# Patient Record
Sex: Male | Born: 1945 | Race: Black or African American | Hispanic: No | Marital: Married | Smoking: Former smoker
Health system: Southern US, Community
[De-identification: ages and names within clinical notes are randomized; demographics above are authoritative.]

## PROBLEM LIST (undated history)

## (undated) DIAGNOSIS — I639 Cerebral infarction, unspecified: Secondary | ICD-10-CM

## (undated) DIAGNOSIS — Z87442 Personal history of urinary calculi: Secondary | ICD-10-CM

## (undated) DIAGNOSIS — M1990 Unspecified osteoarthritis, unspecified site: Secondary | ICD-10-CM

## (undated) DIAGNOSIS — E119 Type 2 diabetes mellitus without complications: Secondary | ICD-10-CM

## (undated) DIAGNOSIS — E7800 Pure hypercholesterolemia, unspecified: Secondary | ICD-10-CM

## (undated) DIAGNOSIS — I10 Essential (primary) hypertension: Secondary | ICD-10-CM

## (undated) DIAGNOSIS — G4730 Sleep apnea, unspecified: Secondary | ICD-10-CM

## (undated) HISTORY — PX: CARDIAC CATHETERIZATION: SHX172

---

## 2004-06-23 ENCOUNTER — Inpatient Hospital Stay: Payer: Self-pay

## 2004-06-23 ENCOUNTER — Other Ambulatory Visit: Payer: Self-pay

## 2004-12-25 ENCOUNTER — Ambulatory Visit: Payer: Self-pay | Admitting: Gastroenterology

## 2005-08-18 ENCOUNTER — Ambulatory Visit: Payer: Self-pay | Admitting: Cardiology

## 2008-04-26 ENCOUNTER — Ambulatory Visit: Payer: Self-pay | Admitting: Gastroenterology

## 2008-09-06 ENCOUNTER — Emergency Department: Payer: Self-pay | Admitting: Emergency Medicine

## 2013-07-20 ENCOUNTER — Ambulatory Visit: Payer: Self-pay | Admitting: Gastroenterology

## 2015-01-07 ENCOUNTER — Emergency Department: Payer: Medicare HMO

## 2015-01-07 ENCOUNTER — Encounter: Payer: Self-pay | Admitting: Emergency Medicine

## 2015-01-07 ENCOUNTER — Emergency Department
Admission: EM | Admit: 2015-01-07 | Discharge: 2015-01-07 | Disposition: A | Discharge status: Home / Self Care | Payer: Medicare HMO | Attending: Emergency Medicine | Admitting: Emergency Medicine

## 2015-01-07 DIAGNOSIS — E119 Type 2 diabetes mellitus without complications: Secondary | ICD-10-CM | POA: Insufficient documentation

## 2015-01-07 DIAGNOSIS — Z87891 Personal history of nicotine dependence: Secondary | ICD-10-CM | POA: Insufficient documentation

## 2015-01-07 DIAGNOSIS — I10 Essential (primary) hypertension: Secondary | ICD-10-CM | POA: Diagnosis not present

## 2015-01-07 DIAGNOSIS — M1611 Unilateral primary osteoarthritis, right hip: Secondary | ICD-10-CM | POA: Diagnosis not present

## 2015-01-07 DIAGNOSIS — Z79899 Other long term (current) drug therapy: Secondary | ICD-10-CM | POA: Diagnosis not present

## 2015-01-07 DIAGNOSIS — M25551 Pain in right hip: Secondary | ICD-10-CM | POA: Diagnosis present

## 2015-01-07 HISTORY — DX: Type 2 diabetes mellitus without complications: E11.9

## 2015-01-07 HISTORY — DX: Pure hypercholesterolemia, unspecified: E78.00

## 2015-01-07 HISTORY — DX: Essential (primary) hypertension: I10

## 2015-01-07 MED ORDER — OXYCODONE HCL 5 MG PO TABS: 5.0000 mg | ORAL_TABLET | Freq: Three times a day (TID) | ORAL | Status: AC | PRN

## 2015-01-07 NOTE — ED Notes (Signed)
Pt to triage via wheelchair. Pt reports pain to his right hip for several years. Over the last few weeks pt reports pain has been getting worse. Tonight the pain woke him from sleep. Pt reports he could not put weight on the leg as the pain was so severe he felt nauaseated.

## 2015-01-07 NOTE — ED Provider Notes (Signed)
[facility] Emergency Department Provider Note ____________________________________________  Time seen: Approximately 7:36 AM  I have reviewed the triage vital signs and the nursing notes.   HISTORY  Chief Complaint Hip Pain   HPI NIVEK POWLEY is a 69 y.o. male resents to the emergency department for evaluation of right hip pain. He is aware that he has arthritis in the right hip and believes that this is just worsening. He denies injury. He has taken Tylenol occasionally for the pain, but otherwise does not take any pain medications. Ambulation and movement of the hip triggers the pain.   Past Medical History  Diagnosis Date  . Diabetes mellitus without complication   . Hypertension   . Hypercholesterolemia     There are no active problems to display for this patient.   History reviewed. No pertinent past surgical history.  Current Outpatient Rx  Name  Route  Sig  Dispense  Refill  . amLODipine (NORVASC) 10 MG tablet   Oral   Take 10 mg by mouth daily.         Marland Kitchen atorvastatin (LIPITOR) 80 MG tablet   Oral   Take 80 mg by mouth daily.         . carbamazepine (TEGRETOL XR) 200 MG 12 hr tablet   Oral   Take 200 mg by mouth 2 (two) times daily.         . cloNIDine (CATAPRES) 0.2 MG tablet   Oral   Take 0.4 mg by mouth 2 (two) times daily.         . furosemide (LASIX) 20 MG tablet   Oral   Take 20 mg by mouth.         . linagliptin (TRADJENTA) 5 MG TABS tablet   Oral   Take 5 mg by mouth daily.         . metFORMIN (GLUCOPHAGE) 1000 MG tablet   Oral   Take 500 mg by mouth 2 (two) times daily with a meal.         . metoprolol (LOPRESSOR) 100 MG tablet   Oral   Take 100 mg by mouth 2 (two) times daily.         . niacin 500 MG tablet   Oral   Take 500 mg by mouth at bedtime.         Marland Kitchen olmesartan (BENICAR) 40 MG tablet   Oral   Take 40 mg by mouth daily.         Marland Kitchen oxyCODONE (ROXICODONE) 5 MG immediate release  tablet   Oral   Take 1 tablet (5 mg total) by mouth every 8 (eight) hours as needed.   20 tablet   0     Allergies Diovan; Ace inhibitors; and Hydrochlorothiazide  No family history on file.  Social History Social History  Substance Use Topics  . Smoking status: Former Games developer  . Smokeless tobacco: None  . Alcohol Use: No    Review of Systems Constitutional: No recent illness. Eyes: No visual changes. ENT: No sore throat. Cardiovascular: Denies chest pain or palpitations. Respiratory: Denies shortness of breath. Gastrointestinal: No abdominal pain.  Genitourinary: Negative for dysuria. Musculoskeletal: Pain in right hip without radiation. Skin: Negative for rash. Neurological: Negative for headaches, focal weakness or numbness. 10-point ROS otherwise negative.  ____________________________________________   PHYSICAL EXAM:  VITAL SIGNS: ED Triage Vitals  Enc Vitals Group     BP 01/07/15 0442 152/90 mmHg     Pulse Rate 01/07/15  0442 80     Resp 01/07/15 0442 18     Temp 01/07/15 0442 98.2 F (36.8 C)     Temp Source 01/07/15 0442 Oral     SpO2 01/07/15 0442 100 %     Weight 01/07/15 0442 215 lb (97.523 kg)     Height 01/07/15 0442 6' (1.829 m)     Head Cir --      Peak Flow --      Pain Score 01/07/15 0443 10     Pain Loc --      Pain Edu? --      Excl. in GC? --     Constitutional: Alert and oriented. Well appearing and in no acute distress. Eyes: Conjunctivae are normal. EOMI. Head: Atraumatic. Nose: No congestion/rhinnorhea. Neck: No stridor.  Respiratory: Normal respiratory effort.   Musculoskeletal: Pain with internal or external rotation of the hip. Neurologic:  Normal speech and language. No gross focal neurologic deficits are appreciated. Speech is normal. No gait instability. Skin:  Skin is warm, dry and intact. Atraumatic. Psychiatric: Mood and affect are normal. Speech and behavior are  normal.  ____________________________________________   LABS (all labs ordered are listed, but only abnormal results are displayed)  Labs Reviewed - No data to display ____________________________________________  RADIOLOGY  Severe osteoarthritis of the right hip.  I, Kem Boroughs, personally viewed and evaluated these images (plain radiographs) as part of my medical decision making.  ____________________________________________   PROCEDURES  Procedure(s) performed: none    ____________________________________________   INITIAL IMPRESSION / ASSESSMENT AND PLAN / ED COURSE  Pertinent labs & imaging results that were available during my care of the patient were reviewed by me and considered in my medical decision making (see chart for details).  Patient was advised to follow up with orthopedics. He was  also advised to return to the ER for symptoms that change or worsen if unable to schedule an appointment.  ____________________________________________   FINAL CLINICAL IMPRESSION(S) / ED DIAGNOSES  Final diagnoses:  Primary osteoarthritis of right hip       Chinita Pester, FNP 01/07/15 0830  Jennye Moccasin, MD 01/07/15 1053

## 2017-05-11 ENCOUNTER — Other Ambulatory Visit: Payer: Self-pay

## 2017-05-11 ENCOUNTER — Emergency Department
Admission: EM | Admit: 2017-05-11 | Discharge: 2017-05-11 | Disposition: A | Discharge status: Home / Self Care | Payer: Medicare HMO | Attending: Emergency Medicine | Admitting: Emergency Medicine

## 2017-05-11 ENCOUNTER — Encounter: Payer: Self-pay | Admitting: *Deleted

## 2017-05-11 DIAGNOSIS — R110 Nausea: Secondary | ICD-10-CM | POA: Diagnosis present

## 2017-05-11 DIAGNOSIS — Z87891 Personal history of nicotine dependence: Secondary | ICD-10-CM | POA: Insufficient documentation

## 2017-05-11 DIAGNOSIS — E119 Type 2 diabetes mellitus without complications: Secondary | ICD-10-CM | POA: Diagnosis not present

## 2017-05-11 DIAGNOSIS — Z79899 Other long term (current) drug therapy: Secondary | ICD-10-CM | POA: Diagnosis not present

## 2017-05-11 DIAGNOSIS — N289 Disorder of kidney and ureter, unspecified: Secondary | ICD-10-CM | POA: Diagnosis not present

## 2017-05-11 DIAGNOSIS — Z7984 Long term (current) use of oral hypoglycemic drugs: Secondary | ICD-10-CM | POA: Insufficient documentation

## 2017-05-11 DIAGNOSIS — I10 Essential (primary) hypertension: Secondary | ICD-10-CM | POA: Diagnosis not present

## 2017-05-11 LAB — CBC
HEMATOCRIT: 38.8 % — AB (ref 40.0–52.0)
HEMOGLOBIN: 12.7 g/dL — AB (ref 13.0–18.0)
MCH: 28.4 pg (ref 26.0–34.0)
MCHC: 32.7 g/dL (ref 32.0–36.0)
MCV: 86.8 fL (ref 80.0–100.0)
Platelets: 240 10*3/uL (ref 150–440)
RBC: 4.47 MIL/uL (ref 4.40–5.90)
RDW: 14.4 % (ref 11.5–14.5)
WBC: 5.5 10*3/uL (ref 3.8–10.6)

## 2017-05-11 LAB — COMPREHENSIVE METABOLIC PANEL
ALBUMIN: 3.9 g/dL (ref 3.5–5.0)
ALK PHOS: 48 U/L (ref 38–126)
ALT: 27 U/L (ref 17–63)
ANION GAP: 7 (ref 5–15)
AST: 24 U/L (ref 15–41)
BUN: 17 mg/dL (ref 6–20)
CALCIUM: 9.4 mg/dL (ref 8.9–10.3)
CO2: 27 mmol/L (ref 22–32)
Chloride: 105 mmol/L (ref 101–111)
Creatinine, Ser: 1.92 mg/dL — ABNORMAL HIGH (ref 0.61–1.24)
GFR calc Af Amer: 39 mL/min — ABNORMAL LOW (ref >60)
GFR calc non Af Amer: 34 mL/min — ABNORMAL LOW (ref >60)
Glucose, Bld: 236 mg/dL — ABNORMAL HIGH (ref 65–99)
Potassium: 3.6 mmol/L (ref 3.5–5.1)
SODIUM: 139 mmol/L (ref 135–145)
Total Bilirubin: 0.6 mg/dL (ref 0.3–1.2)
Total Protein: 6.8 g/dL (ref 6.5–8.1)

## 2017-05-11 LAB — TROPONIN I

## 2017-05-11 NOTE — Discharge Instructions (Signed)
As we discussed, your workup today was reassuring, and likely you were feeling sick to her stomach because of eating too big meal for the holidays.  Your kidney function is not "normal" although it may be normal for you; we cannot access your medical records to tell us what is your baseline kidney function.  Today your BUN was 17 but your creatinine was 1.92.  Please let your primary care doctor know this when you call at the next available opportunity to schedule a follow-up appointment.  Your doctor will likely want to recheck your blood work at that time and less this is normal for you.  Return to the emergency department if you develop new or worsening symptoms that concern you.

## 2017-05-11 NOTE — ED Provider Notes (Signed)
[facility] Emergency Department Provider Note  ____________________________________________   First MD Initiated Contact with Patient 05/11/17 [phone]     (approximate)  I have reviewed the triage vital signs and the nursing notes.   HISTORY  Chief Complaint Nausea    HPI Eric Andersen is a 71 y.o. male with medical history as listed below but who has very little medical information and [facility] or even in care everywhere.  He presents by EMS for evaluation of acute onset nausea after eating a big Christmas meal with his family.  He felt very sick to his stomach but did not vomit.  He denies chest pain, shortness of breath, abdominal pain, dizziness, weakness, and headache.  His nausea had completely resolved after getting to the emergency department.  He is happy, cheerful, and in his normal state of health according to the family members who are present.  The family confirmed that he did eat a very large meal and he laughed and told me that he tried eat a little bit of everything and by multiple family members at a big get together.  Reports no distress at this time.  His symptoms were relatively acute in onset, severe, and improved without any intervention.  Nothing in particular made the symptoms better nor worse.  Past Medical History:  Diagnosis Date  . Diabetes mellitus without complication (HCC)   . Hypercholesterolemia   . Hypertension     There are no active problems to display for this patient.   No past surgical history on file.  Prior to Admission medications   Medication Sig Start Date End Date Taking? Authorizing Provider  amLODipine (NORVASC) 10 MG tablet Take 10 mg by mouth daily.    [provider]  atorvastatin (LIPITOR) 80 MG tablet Take 80 mg by mouth daily.    [provider]  carbamazepine (TEGRETOL XR) 200 MG 12 hr tablet Take 200 mg by mouth 2 (two) times daily.    [provider]  cloNIDine (CATAPRES) 0.2 MG  tablet Take 0.4 mg by mouth 2 (two) times daily.    [provider]  furosemide (LASIX) 20 MG tablet Take 20 mg by mouth.    [provider]  linagliptin (TRADJENTA) 5 MG TABS tablet Take 5 mg by mouth daily.    [provider]  metFORMIN (GLUCOPHAGE) 1000 MG tablet Take 500 mg by mouth 2 (two) times daily with a meal.    [provider]  metoprolol (LOPRESSOR) 100 MG tablet Take 100 mg by mouth 2 (two) times daily.    [provider]  niacin 500 MG tablet Take 500 mg by mouth at bedtime.    [provider]  olmesartan (BENICAR) 40 MG tablet Take 40 mg by mouth daily.    [provider]    Allergies Diovan [valsartan]; Ace inhibitors; and Hydrochlorothiazide  No family history on file.  Social History Social History   Tobacco Use  . Smoking status: Former Smoker  Substance Use Topics  . Alcohol use: No  . Drug use: Not on file    Review of Systems Constitutional: No fever/chills.  No weakness nor lightheadedness. Cardiovascular: Denies chest pain. Respiratory: Denies shortness of breath. Gastrointestinal: Nausea, no vomiting.  No diarrhea.  No abdominal pain.   Genitourinary: Negative for dysuria. Musculoskeletal: Negative for neck pain.  Negative for back pain. Integumentary: Negative for rash. Neurological: Negative for headaches, focal weakness or numbness.   ____________________________________________   PHYSICAL EXAM:  ED  Triage Vitals  Enc Vitals Group     BP 05/11/17 0140 125/80     Pulse Rate 05/11/17 0140 82     Resp 05/11/17 0140 (!) 25     Temp 05/11/17 0032 98.1 F (36.7 C)     Temp Source 05/11/17 0032 Oral     SpO2 05/11/17 0140 100 %     Weight 05/11/17 0033 99.8 kg (220 lb)     Height 05/11/17 0033 1.854 m (6\' 1" )     Head Circumference --      Peak Flow --      Pain Score --      Pain Loc --      Pain Edu? --      Excl. in GC? --      Constitutional: Alert and oriented. Well  appearing and in no acute distress. Eyes: Conjunctivae are normal.  Head: Atraumatic. Nose: No congestion/rhinnorhea. Mouth/Throat: Mucous membranes are moist. Cardiovascular: Normal rate, regular rhythm. Good peripheral circulation. Grossly normal heart sounds. Respiratory: Normal respiratory effort.  No retractions. Lungs CTAB. Gastrointestinal: Soft and mildly distended but nontender. Musculoskeletal: No lower extremity tenderness nor edema. No gross deformities of extremities. Neurologic:  Normal speech and language. No gross focal neurologic deficits are appreciated.  Skin:  Skin is warm, dry and intact. No rash noted. Psychiatric: Mood and affect are normal. Speech and behavior are normal.  ____________________________________________   LABS (all labs ordered are listed, but only abnormal results are displayed)  Labs Reviewed  COMPREHENSIVE METABOLIC PANEL - Abnormal; Notable for the following components:      Result Value   Glucose, Bld 236 (*)    Creatinine, Ser 1.92 (*)    GFR calc non Af Amer 34 (*)    GFR calc Af Amer 39 (*)    All other components within normal limits  CBC - Abnormal; Notable for the following components:   Hemoglobin 12.7 (*)    HCT 38.8 (*)    All other components within normal limits  TROPONIN I   ____________________________________________  EKG  ED ECG REPORT I, Loleta Roseory Miriya Cloer, the attending physician, personally viewed and interpreted this ECG.  Date: 05/11/2017 EKG Time: 00: 35 Rate: 88 Rhythm: Sinus rhythm with first-degree AV block with PR interval of 223 ms QRS Axis: normal Intervals: Right bundle branch block and left anterior fascicular block ST/T Wave abnormalities: Non-specific ST segment / T-wave changes, but no evidence of acute ischemia. Narrative Interpretation: no evidence of acute ischemia   ____________________________________________  RADIOLOGY   No results  found.  ____________________________________________   PROCEDURES  Critical Care performed: No   Procedure(s) performed:   Procedures   ____________________________________________   INITIAL IMPRESSION / ASSESSMENT AND PLAN / ED COURSE  As part of my medical decision making, I reviewed the following data within the electronic MEDICAL RECORD NUMBER Nursing notes reviewed and incorporated, EKG interpreted  and Old chart reviewed    Differential diagnosis includes, but is not limited to, nausea secondary to overeating at a holiday meal, obstruction/ileus, infectious process, etc.  However, the patient is very well-appearing and happy at this time and completely asymptomatic.  Vitals signs are normal.  Lab workup is reassuing except for a creatinine of 1.92 but with a normal BUN.  I looked back through the medical records and in care everywhere several years ago the patient had a creatinine of 1.3.  I suspect that 1.9 may be the patient's baseline; he has no evidence of dehydration and there is  nothing about his current presentation, physical exam, or history of present illness to suggest that he is in acute renal failure.  I discussed with the family and the patient and they do not know what his baseline creatinine is and he goes to an outside primary care provider and we do not have access to the records.  Given how well-appearing he is at this time, I encouraged him to drink plenty of clear, low calorie fluids such as water or low calorie Gatorade to stay hydrated and follow-up at the next available opportunity with his primary care doctor.  I encouraged him to have repeat lab work drawn to make sure that his kidney function is at his own baseline, but based on his current presentation I do not feel he would benefit from admission for further workup of what very likely is essentially baseline kidney function for him.  They understand and agree with the plan.  Additionally based on his  presentation I have a very low suspicion that this is an anginal equivalent.  He had a large meal, overate, was nauseated, and now feels better.  He and his family are comfortable with him going home.  I gave my usual and customary return precautions.     ____________________________________________  FINAL CLINICAL IMPRESSION(S) / ED DIAGNOSES  Final diagnoses:  Nausea  Renal insufficiency     MEDICATIONS GIVEN DURING THIS VISIT:  Medications - No data to display   ED Discharge Orders    None       Note:  This document was prepared using Dragon voice recognition software and may include unintentional dictation errors.    Loleta Rose, MD 05/11/17 [phone]

## 2017-05-11 NOTE — ED Triage Notes (Signed)
Per EMS pt ate dinner and then felt nauseated, diaphoretic and weak. Pt is diabetic and his sugar was 206. Pt feels well now.

## 2017-06-22 ENCOUNTER — Inpatient Hospital Stay
Admission: EM | Admit: 2017-06-22 | Discharge: 2017-06-25 | DRG: 244 | Disposition: A | Discharge status: Home / Self Care | Payer: Medicare HMO | Attending: Internal Medicine | Admitting: Internal Medicine

## 2017-06-22 ENCOUNTER — Other Ambulatory Visit: Payer: Self-pay

## 2017-06-22 ENCOUNTER — Encounter
Admission: EM | Disposition: A | Discharge status: Home / Self Care | Payer: Self-pay | Source: Home / Self Care | Attending: Internal Medicine

## 2017-06-22 ENCOUNTER — Emergency Department: Payer: Medicare HMO

## 2017-06-22 DIAGNOSIS — Z888 Allergy status to other drugs, medicaments and biological substances status: Secondary | ICD-10-CM | POA: Diagnosis not present

## 2017-06-22 DIAGNOSIS — R079 Chest pain, unspecified: Secondary | ICD-10-CM

## 2017-06-22 DIAGNOSIS — Z950 Presence of cardiac pacemaker: Secondary | ICD-10-CM

## 2017-06-22 DIAGNOSIS — E7800 Pure hypercholesterolemia, unspecified: Secondary | ICD-10-CM | POA: Diagnosis present

## 2017-06-22 DIAGNOSIS — Z8673 Personal history of transient ischemic attack (TIA), and cerebral infarction without residual deficits: Secondary | ICD-10-CM

## 2017-06-22 DIAGNOSIS — I441 Atrioventricular block, second degree: Principal | ICD-10-CM | POA: Diagnosis present

## 2017-06-22 DIAGNOSIS — G4733 Obstructive sleep apnea (adult) (pediatric): Secondary | ICD-10-CM | POA: Diagnosis present

## 2017-06-22 DIAGNOSIS — Z87891 Personal history of nicotine dependence: Secondary | ICD-10-CM | POA: Diagnosis not present

## 2017-06-22 DIAGNOSIS — N183 Chronic kidney disease, stage 3 (moderate): Secondary | ICD-10-CM | POA: Diagnosis present

## 2017-06-22 DIAGNOSIS — Z79899 Other long term (current) drug therapy: Secondary | ICD-10-CM

## 2017-06-22 DIAGNOSIS — R001 Bradycardia, unspecified: Secondary | ICD-10-CM | POA: Diagnosis present

## 2017-06-22 DIAGNOSIS — E1122 Type 2 diabetes mellitus with diabetic chronic kidney disease: Secondary | ICD-10-CM | POA: Diagnosis present

## 2017-06-22 DIAGNOSIS — R531 Weakness: Secondary | ICD-10-CM

## 2017-06-22 DIAGNOSIS — Z7984 Long term (current) use of oral hypoglycemic drugs: Secondary | ICD-10-CM

## 2017-06-22 DIAGNOSIS — I129 Hypertensive chronic kidney disease with stage 1 through stage 4 chronic kidney disease, or unspecified chronic kidney disease: Secondary | ICD-10-CM | POA: Diagnosis present

## 2017-06-22 HISTORY — DX: Cerebral infarction, unspecified: I63.9

## 2017-06-22 HISTORY — PX: TEMPORARY PACEMAKER: CATH118268

## 2017-06-22 LAB — PROTIME-INR
INR: 0.9
Prothrombin Time: 12.1 seconds (ref 11.4–15.2)

## 2017-06-22 LAB — COMPREHENSIVE METABOLIC PANEL
ALT: 93 U/L — AB (ref 17–63)
ANION GAP: 10 (ref 5–15)
AST: 87 U/L — ABNORMAL HIGH (ref 15–41)
Albumin: 4 g/dL (ref 3.5–5.0)
Alkaline Phosphatase: 54 U/L (ref 38–126)
BUN: 17 mg/dL (ref 6–20)
CHLORIDE: 104 mmol/L (ref 101–111)
CO2: 26 mmol/L (ref 22–32)
Calcium: 9.7 mg/dL (ref 8.9–10.3)
Creatinine, Ser: 1.59 mg/dL — ABNORMAL HIGH (ref 0.61–1.24)
GFR calc non Af Amer: 43 mL/min — ABNORMAL LOW (ref >60)
GFR, EST AFRICAN AMERICAN: 49 mL/min — AB (ref >60)
Glucose, Bld: 233 mg/dL — ABNORMAL HIGH (ref 65–99)
POTASSIUM: 3.7 mmol/L (ref 3.5–5.1)
SODIUM: 140 mmol/L (ref 135–145)
Total Bilirubin: 0.4 mg/dL (ref 0.3–1.2)
Total Protein: 7.4 g/dL (ref 6.5–8.1)

## 2017-06-22 LAB — CBC WITH DIFFERENTIAL/PLATELET
Basophils Absolute: 0 10*3/uL (ref 0–0.1)
Basophils Relative: 0 %
EOS ABS: 0.2 10*3/uL (ref 0–0.7)
EOS PCT: 3 %
HCT: 40.3 % (ref 40.0–52.0)
Hemoglobin: 13.3 g/dL (ref 13.0–18.0)
LYMPHS PCT: 19 %
Lymphs Abs: 1 10*3/uL (ref 1.0–3.6)
MCH: 28.2 pg (ref 26.0–34.0)
MCHC: 33 g/dL (ref 32.0–36.0)
MCV: 85.6 fL (ref 80.0–100.0)
MONO ABS: 0.4 10*3/uL (ref 0.2–1.0)
Monocytes Relative: 6 %
Neutro Abs: 4.1 10*3/uL (ref 1.4–6.5)
Neutrophils Relative %: 72 %
PLATELETS: 219 10*3/uL (ref 150–440)
RBC: 4.71 MIL/uL (ref 4.40–5.90)
RDW: 14.4 % (ref 11.5–14.5)
WBC: 5.6 10*3/uL (ref 3.8–10.6)

## 2017-06-22 LAB — TROPONIN I
Troponin I: <0.03 ng/mL (ref <0.03)
Troponin I: 0.06 ng/mL (ref <0.03)
Troponin I: 0.06 ng/mL (ref <0.03)

## 2017-06-22 LAB — APTT: aPTT: 26 seconds (ref 24–36)

## 2017-06-22 LAB — TSH: TSH: 1.773 u[IU]/mL (ref 0.350–4.500)

## 2017-06-22 LAB — GLUCOSE, CAPILLARY
Glucose-Capillary: 132 mg/dL — ABNORMAL HIGH (ref 65–99)
Glucose-Capillary: 120 mg/dL — ABNORMAL HIGH (ref 65–99)
Glucose-Capillary: 149 mg/dL — ABNORMAL HIGH (ref 65–99)

## 2017-06-22 LAB — MAGNESIUM: Magnesium: 1.8 mg/dL (ref 1.7–2.4)

## 2017-06-22 LAB — HEMOGLOBIN A1C
Hgb A1c MFr Bld: 6.8 % — ABNORMAL HIGH (ref 4.8–5.6)
MEAN PLASMA GLUCOSE: 148.46 mg/dL

## 2017-06-22 LAB — CARBAMAZEPINE LEVEL, TOTAL: Carbamazepine Lvl: 6.1 ug/mL (ref 4.0–12.0)

## 2017-06-22 LAB — MRSA PCR SCREENING: MRSA by PCR: NEGATIVE

## 2017-06-22 SURGERY — INSERTION, CARDIAC PACEMAKER
Anesthesia: Moderate Sedation

## 2017-06-22 SURGERY — TEMPORARY PACEMAKER
Anesthesia: Moderate Sedation

## 2017-06-22 MED ORDER — ONDANSETRON HCL 4 MG PO TABS: 4.0000 mg | ORAL_TABLET | Freq: Four times a day (QID) | ORAL | Status: DC | PRN

## 2017-06-22 MED ORDER — SODIUM CHLORIDE 0.9 % IV SOLN: INTRAVENOUS | Status: DC

## 2017-06-22 MED ORDER — CHLORHEXIDINE GLUCONATE 4 % EX LIQD
60.0000 mL | Freq: Once | CUTANEOUS | Status: AC
  Administered 2017-06-23: 4 via TOPICAL

## 2017-06-22 MED ORDER — CEFAZOLIN SODIUM-DEXTROSE 2-4 GM/100ML-% IV SOLN: 2.0000 g | INTRAVENOUS | Status: DC

## 2017-06-22 MED ORDER — POLYETHYLENE GLYCOL 3350 17 G PO PACK: 17.0000 g | PACK | Freq: Every day | ORAL | Status: DC | PRN

## 2017-06-22 MED ORDER — INSULIN ASPART 100 UNIT/ML ~~LOC~~ SOLN
0.0000 [IU] | Freq: Every day | SUBCUTANEOUS | Status: DC
  Administered 2017-06-23: 2 [IU] via SUBCUTANEOUS
  Filled 2017-06-22: qty 1

## 2017-06-22 MED ORDER — ACETAMINOPHEN 325 MG PO TABS
650.0000 mg | ORAL_TABLET | Freq: Four times a day (QID) | ORAL | Status: DC | PRN
  Administered 2017-06-23: 650 mg via ORAL
  Filled 2017-06-22: qty 2

## 2017-06-22 MED ORDER — CARBAMAZEPINE ER 100 MG PO TB12
200.0000 mg | ORAL_TABLET | Freq: Two times a day (BID) | ORAL | Status: DC
  Administered 2017-06-22 – 2017-06-25 (×6): 200 mg via ORAL
  Filled 2017-06-22 (×8): qty 2

## 2017-06-22 MED ORDER — ATROPINE SULFATE 1 MG/ML IJ SOLN
0.5000 mg | INTRAMUSCULAR | Status: DC | PRN
  Filled 2017-06-22: qty 0.5

## 2017-06-22 MED ORDER — ATROPINE SULFATE 1 MG/10ML IJ SOSY
PREFILLED_SYRINGE | INTRAMUSCULAR | Status: AC
  Administered 2017-06-22: 0.5 mg
  Filled 2017-06-22: qty 10

## 2017-06-22 MED ORDER — ATROPINE SULFATE 1 MG/ML IJ SOLN
0.5000 mg | Freq: Once | INTRAMUSCULAR | Status: DC
  Filled 2017-06-22: qty 0.5

## 2017-06-22 MED ORDER — HYDRALAZINE HCL 25 MG PO TABS
25.0000 mg | ORAL_TABLET | Freq: Three times a day (TID) | ORAL | Status: DC
Start: 2017-06-22 — End: 2017-06-25
  Administered 2017-06-22 – 2017-06-25 (×6): 25 mg via ORAL
  Filled 2017-06-22 (×6): qty 1

## 2017-06-22 MED ORDER — ALBUTEROL SULFATE (2.5 MG/3ML) 0.083% IN NEBU: 2.5000 mg | INHALATION_SOLUTION | RESPIRATORY_TRACT | Status: DC | PRN

## 2017-06-22 MED ORDER — SODIUM CHLORIDE 0.9 % IR SOLN: 80.0000 mg | Status: AC

## 2017-06-22 MED ORDER — SODIUM CHLORIDE 0.9% FLUSH
3.0000 mL | Freq: Two times a day (BID) | INTRAVENOUS | Status: DC
  Administered 2017-06-22 – 2017-06-25 (×6): 3 mL via INTRAVENOUS

## 2017-06-22 MED ORDER — AMLODIPINE BESYLATE 10 MG PO TABS
10.0000 mg | ORAL_TABLET | Freq: Every day | ORAL | Status: DC
  Administered 2017-06-23 – 2017-06-25 (×3): 10 mg via ORAL
  Filled 2017-06-22 (×3): qty 1

## 2017-06-22 MED ORDER — SODIUM CHLORIDE 0.9 % IV SOLN
INTRAVENOUS | Status: DC
  Administered 2017-06-23 (×3): via INTRAVENOUS

## 2017-06-22 MED ORDER — SODIUM CHLORIDE 0.9 % IV SOLN
Freq: Once | INTRAVENOUS | Status: AC
  Administered 2017-06-22: 10:00:00 via INTRAVENOUS

## 2017-06-22 MED ORDER — ONDANSETRON HCL 4 MG/2ML IJ SOLN: 4.0000 mg | Freq: Four times a day (QID) | INTRAMUSCULAR | Status: DC | PRN

## 2017-06-22 MED ORDER — CEFAZOLIN SODIUM-DEXTROSE 2-4 GM/100ML-% IV SOLN
2.0000 g | INTRAVENOUS | Status: AC
  Administered 2017-06-23: 2 g via INTRAVENOUS
  Filled 2017-06-22: qty 100

## 2017-06-22 MED ORDER — INSULIN ASPART 100 UNIT/ML ~~LOC~~ SOLN
0.0000 [IU] | Freq: Three times a day (TID) | SUBCUTANEOUS | Status: DC
  Administered 2017-06-23: 2 [IU] via SUBCUTANEOUS
  Administered 2017-06-24: 3 [IU] via SUBCUTANEOUS
  Administered 2017-06-24: 5 [IU] via SUBCUTANEOUS
  Administered 2017-06-24: 2 [IU] via SUBCUTANEOUS
  Administered 2017-06-25 (×2): 3 [IU] via SUBCUTANEOUS
  Filled 2017-06-22 (×6): qty 1

## 2017-06-22 MED ORDER — FUROSEMIDE 20 MG PO TABS
20.0000 mg | ORAL_TABLET | Freq: Every day | ORAL | Status: DC
  Administered 2017-06-23 – 2017-06-25 (×3): 20 mg via ORAL
  Filled 2017-06-22 (×3): qty 1

## 2017-06-22 MED ORDER — HEPARIN SODIUM (PORCINE) 5000 UNIT/ML IJ SOLN
5000.0000 [IU] | Freq: Three times a day (TID) | INTRAMUSCULAR | Status: DC
  Administered 2017-06-22 – 2017-06-25 (×7): 5000 [IU] via SUBCUTANEOUS
  Filled 2017-06-22 (×6): qty 1

## 2017-06-22 MED ORDER — ACETAMINOPHEN 650 MG RE SUPP: 650.0000 mg | Freq: Four times a day (QID) | RECTAL | Status: DC | PRN

## 2017-06-22 MED ORDER — CHLORHEXIDINE GLUCONATE 4 % EX LIQD: 60.0000 mL | Freq: Once | CUTANEOUS | Status: DC

## 2017-06-22 MED ORDER — NIACIN 500 MG PO TABS
500.0000 mg | ORAL_TABLET | Freq: Every day | ORAL | Status: DC
Start: 2017-06-22 — End: 2017-06-25
  Administered 2017-06-22 – 2017-06-24 (×3): 500 mg via ORAL
  Filled 2017-06-22 (×4): qty 1

## 2017-06-22 MED ORDER — ATROPINE SULFATE 1 MG/ML IJ SOLN
0.5000 mg | Freq: Once | INTRAMUSCULAR | Status: AC
  Administered 2017-06-22: 0.5 mg via INTRAVENOUS
  Filled 2017-06-22: qty 0.5

## 2017-06-22 MED ORDER — ATROPINE SULFATE 1 MG/10ML IJ SOSY
PREFILLED_SYRINGE | INTRAMUSCULAR | Status: AC
  Filled 2017-06-22: qty 10

## 2017-06-22 MED ORDER — SODIUM CHLORIDE 0.9 % IR SOLN: 80.0000 mg | Status: DC

## 2017-06-22 MED ORDER — HYDRALAZINE HCL 20 MG/ML IJ SOLN: 10.0000 mg | Freq: Four times a day (QID) | INTRAMUSCULAR | Status: DC | PRN

## 2017-06-22 MED ORDER — ATORVASTATIN CALCIUM 80 MG PO TABS
80.0000 mg | ORAL_TABLET | Freq: Every day | ORAL | Status: DC
  Administered 2017-06-22 – 2017-06-24 (×2): 80 mg via ORAL
  Filled 2017-06-22: qty 4
  Filled 2017-06-22 (×2): qty 1
  Filled 2017-06-22: qty 4
  Filled 2017-06-22: qty 1

## 2017-06-22 SURGICAL SUPPLY — 6 items
CABLE ADAPT CONN TEMP 6FT (ADAPTER) ×3 IMPLANT
NEEDLE PERC 18GX7CM (NEEDLE) ×3 IMPLANT
PACK CARDIAC CATH (CUSTOM PROCEDURE TRAY) ×3 IMPLANT
SHEATH PINNACLE 7F 10CM (SHEATH) ×3 IMPLANT
SLEEVE REPOSITIONING LENGTH 30 (MISCELLANEOUS) ×3 IMPLANT
WIRE PACING TEMP ST TIP 5 (CATHETERS) ×3 IMPLANT

## 2017-06-22 NOTE — ED Notes (Signed)
Dr Williams at bedside 

## 2017-06-22 NOTE — Plan of Care (Signed)
Family and patient educated re ICU

## 2017-06-22 NOTE — ED Provider Notes (Signed)
[facility] Emergency Department Provider Note       Time seen: ----------------------------------------- 9:55 AM on 06/22/2017 -----------------------------------------   I have reviewed the triage vital signs and the nursing notes.  HISTORY   Chief Complaint Weakness and Bradycardia    HPI Eric Andersen is a 72 y.o. male with a history of diabetes, hyperlipidemia and hypertension who presents to the ED for abdominal pain and generalized weakness with shortness of breath that started early this morning.  He is not currently complaining any pain but feeling weak.  He was noted to be bradycardic on arrival.  Patient states he has not had any arrhythmias in the past.  He denies any changes in his medication or recent illness.  He states he has not taken too much of his medication.  Past Medical History:  Diagnosis Date  . Diabetes mellitus without complication (HCC)   . Hypercholesterolemia   . Hypertension     There are no active problems to display for this patient.   No past surgical history on file.  Allergies Diovan [valsartan]; Ace inhibitors; and Hydrochlorothiazide  Social History Social History   Tobacco Use  . Smoking status: Former Smoker  Substance Use Topics  . Alcohol use: No  . Drug use: Not on file    Review of Systems Constitutional: Negative for fever. Cardiovascular: Negative for chest pain. Respiratory: Negative for shortness of breath. Gastrointestinal: Negative for abdominal pain, vomiting and diarrhea. Musculoskeletal: Negative for back pain. Skin: Negative for rash. Neurological: Positive for generalized weakness  All systems negative/normal/unremarkable except as stated in the HPI  ____________________________________________   PHYSICAL EXAM:  VITAL SIGNS: ED Triage Vitals [06/22/17 0950]  Enc Vitals Group     BP (!) 153/88     Pulse Rate (!) 35     Resp 17     Temp 97.6 F (36.4 C)     Temp Source  Oral     SpO2 100 %     Weight 211 lb (95.7 kg)     Height 6' (1.829 m)     Head Circumference      Peak Flow      Pain Score 0     Pain Loc      Pain Edu?      Excl. in GC?    Constitutional: Alert and oriented. Well appearing and in no distress. Eyes: Conjunctivae are normal. Normal extraocular movements. ENT   Head: Normocephalic and atraumatic.   Nose: No congestion/rhinnorhea.   Mouth/Throat: Mucous membranes are moist.   Neck: No stridor. Cardiovascular: Slow rate, regular rhythm. No murmurs, rubs, or gallops. Respiratory: Normal respiratory effort without tachypnea nor retractions. Breath sounds are clear and equal bilaterally. No wheezes/rales/rhonchi. Gastrointestinal: Soft and nontender. Normal bowel sounds Musculoskeletal: Nontender with normal range of motion in extremities. No lower extremity tenderness nor edema. Neurologic:  Normal speech and language. No gross focal neurologic deficits are appreciated.  Skin:  Skin is warm, dry and intact. No rash noted. Psychiatric: Mood and affect are normal. Speech and behavior are normal.  ____________________________________________  EKG: Interpreted by me.  Bradycardia with a rate of 36 bpm, second-degree type II AV block, LVH, leftward axis  ____________________________________________  ED COURSE:  As part of my medical decision making, I reviewed the following data within the electronic MEDICAL RECORD NUMBER History obtained from family if available, nursing notes, old chart and ekg, as well as notes from prior ED visits. Patient presented for weakness and bradycardia, we  will assess with labs and imaging as indicated at this time. Clinical Course as of Jun 22 1040  Tue Jun 22, 2017  1039 Patient had a long series of SA nodal firing but no subsequent ventricular depolarization.  I have consulted cardiology for pacemaker placement.  [JW]    Clinical Course User Index [JW] Emily FilbertWilliams, Luevenia Mcavoy E, MD    Procedures ____________________________________________   LABS (pertinent positives/negatives)  Labs Reviewed  COMPREHENSIVE METABOLIC PANEL - Abnormal; Notable for the following components:      Result Value   Glucose, Bld 233 (*)    Creatinine, Ser 1.59 (*)    AST 87 (*)    ALT 93 (*)    GFR calc non Af Amer 43 (*)    GFR calc Af Amer 49 (*)    All other components within normal limits  MAGNESIUM  CBC WITH DIFFERENTIAL/PLATELET  TROPONIN I  PROTIME-INR  APTT  CARBAMAZEPINE LEVEL, TOTAL   CRITICAL CARE Performed by: Ulice DashJohnathan E Perlie Stene   Total critical care time: 30 minutes  Critical care time was exclusive of separately billable procedures and treating other patients.  Critical care was necessary to treat or prevent imminent or life-threatening deterioration.  Critical care was time spent personally by me on the following activities: development of treatment plan with patient and/or surrogate as well as nursing, discussions with consultants, evaluation of patient's response to treatment, examination of patient, obtaining history from patient or surrogate, ordering and performing treatments and interventions, ordering and review of laboratory studies, ordering and review of radiographic studies, pulse oximetry and re-evaluation of patient's condition.  RADIOLOGY Images were viewed by me  Chest x-ray IMPRESSION: No acute cardiopulmonary findings. ____________________________________________  DIFFERENTIAL DIAGNOSIS   AV block, electrolyte abnormality, renal failure, medication side effect, overdose  FINAL ASSESSMENT AND PLAN  Bradycardia, type II AV block   Plan: Patient had presented for red cardia and weakness. Patient's labs were grossly unremarkable, he does have mild renal insufficiency. Patient's imaging was negative.  He has had several significant complete A-V dissociation runs while in the ER.  He will need emergent pacemaker placement.   Ulice DashJohnathan  E Fartun Paradiso, MD   Note: This note was generated in part or whole with voice recognition software. Voice recognition is usually quite accurate but there are transcription errors that can and very often do occur. I apologize for any typographical errors that were not detected and corrected.      Emily FilbertWilliams, Valynn Schamberger E, MD 06/22/17 [id number]

## 2017-06-22 NOTE — Progress Notes (Deleted)
Telemonitoring initiated

## 2017-06-22 NOTE — H&P (Signed)
[employer] [facility] - [place] at [facility]   PATIENT NAME: Eric Andersen    MR#:  [id number]  DATE OF BIRTH:  04/24/1946  DATE OF ADMISSION:  06/22/2017  PRIMARY CARE PHYSICIAN: Leanna SatoMiles, Linda M, MD   REQUESTING/REFERRING PHYSICIAN: Dr. Mayford KnifeWilliams  CHIEF COMPLAINT:   Chief Complaint  Patient presents with  . Weakness  . Bradycardia    HISTORY OF PRESENT ILLNESS:  Eric Andersen  is a 72 y.o. male with a known history of attention, diabetes mellitus, CKD stage III on metoprolol, clonidine and amlodipine presents to the hospital due to weakness/shortness of breath.  Here in the emergency room he has been found to have sinus bradycardia in the 30s with type II heart block..  Soft complete A-V dissociation with lightheadedness.  Patient is being admitted to stepdown.  Case discussed with Dr. Vennie Homansalderwood.  Will need pacemaker. No thyroid abnormalities. No chest pain or palpitations.  PAST MEDICAL HISTORY:   Past Medical History:  Diagnosis Date  . Diabetes mellitus without complication (HCC)   . Hypercholesterolemia   . Hypertension   . Stroke [facility](HCC)     PAST SURGICAL HISTORY:   Past Surgical History:  Procedure Laterality Date  . CARDIAC CATHETERIZATION      SOCIAL HISTORY:   Social History   Tobacco Use  . Smoking status: Former Games developermoker  . Smokeless tobacco: Never Used  Substance Use Topics  . Alcohol use: No    FAMILY HISTORY:  No family history on file.  DRUG ALLERGIES:   Allergies  Allergen Reactions  . Diovan [Valsartan] Cough  . Ace Inhibitors Rash  . Hydrochlorothiazide Rash    REVIEW OF SYSTEMS:   Review of Systems  Constitutional: Positive for malaise/fatigue. Negative for chills, fever and weight loss.  HENT: Negative for hearing loss and nosebleeds.   Eyes: Negative for blurred vision, double vision and pain.  Respiratory: Positive for shortness of breath. Negative for cough, hemoptysis, sputum production and wheezing.   Cardiovascular:  Negative for chest pain, palpitations, orthopnea and leg swelling.  Gastrointestinal: Negative for abdominal pain, constipation, diarrhea, nausea and vomiting.  Genitourinary: Negative for dysuria and hematuria.  Musculoskeletal: Negative for back pain, falls and myalgias.  Skin: Negative for rash.  Neurological: Positive for dizziness and weakness. Negative for tremors, sensory change, speech change, focal weakness, seizures and headaches.  Endo/Heme/Allergies: Does not bruise/bleed easily.  Psychiatric/Behavioral: Negative for depression and memory loss. The patient is not nervous/anxious.     MEDICATIONS AT HOME:   Prior to Admission medications   Medication Sig Start Date End Date Taking? Authorizing Provider  amLODipine (NORVASC) 10 MG tablet Take 10 mg by mouth daily.   Yes [provider]  atorvastatin (LIPITOR) 80 MG tablet Take 80 mg by mouth daily.   Yes [provider]  carbamazepine (TEGRETOL XR) 200 MG 12 hr tablet Take 200 mg by mouth 2 (two) times daily.   Yes [provider]  cloNIDine (CATAPRES) 0.2 MG tablet Take 0.2 mg by mouth 2 (two) times daily.    Yes [provider]  furosemide (LASIX) 20 MG tablet Take 20 mg by mouth daily.    Yes [provider]  glipiZIDE (GLUCOTROL XL) 2.5 MG 24 hr tablet Take 2.5 mg by mouth daily with breakfast.   Yes [provider]  linagliptin (TRADJENTA) 5 MG TABS tablet Take 5 mg by mouth daily.   Yes [provider]  metFORMIN (GLUCOPHAGE) 1000 MG tablet Take 500 mg by mouth 2 (two)  times daily with a meal.   Yes [provider]  metoprolol (LOPRESSOR) 100 MG tablet Take 100 mg by mouth 2 (two) times daily.   Yes [provider]  niacin 500 MG tablet Take 500 mg by mouth at bedtime.   Yes [provider]  olmesartan (BENICAR) 40 MG tablet Take 40 mg by mouth daily.   Yes [provider]     VITAL SIGNS:  Blood pressure 127/77, pulse (!)  37, temperature 97.6 F (36.4 C), temperature source Oral, resp. rate 12, height 6' (1.829 m), weight 95.7 kg (211 lb), SpO2 98 %.  PHYSICAL EXAMINATION:  Physical Exam  GENERAL:  72 y.o.-year-old patient lying in the bed EYES: Pupils equal, round, reactive to light and accommodation. No scleral icterus. Extraocular muscles intact.  HEENT: Head atraumatic, normocephalic. Oropharynx and nasopharynx clear. No oropharyngeal erythema, moist oral mucosa  NECK:  Supple, no jugular venous distention. No thyroid enlargement, no tenderness.  LUNGS: Normal breath sounds bilaterally, no wheezing, rales, rhonchi. No use of accessory muscles of respiration.  CARDIOVASCULAR: S1, S2.  Bradycardia ABDOMEN: Soft, nontender, nondistended. Bowel sounds present. No organomegaly or mass.  EXTREMITIES: No pedal edema, cyanosis, or clubbing. + 2 pedal & radial pulses b/l.   NEUROLOGIC: Cranial nerves II through XII are intact. No focal Motor or sensory deficits appreciated b/l PSYCHIATRIC: The patient is alert and oriented x 3. Good affect.  SKIN: No obvious rash, lesion, or ulcer.   LABORATORY PANEL:   CBC Recent Labs  Lab 06/22/17 0953  WBC 5.6  HGB 13.3  HCT 40.3  PLT 219   ------------------------------------------------------------------------------------------------------------------  Chemistries  Recent Labs  Lab 06/22/17 0953  NA 140  K 3.7  CL 104  CO2 26  GLUCOSE 233*  BUN 17  CREATININE 1.59*  CALCIUM 9.7  MG 1.8  AST 87*  ALT 93*  ALKPHOS 54  BILITOT 0.4   ------------------------------------------------------------------------------------------------------------------  Cardiac Enzymes Recent Labs  Lab 06/22/17 0953  TROPONINI <0.03   ------------------------------------------------------------------------------------------------------------------  RADIOLOGY:  Dg Chest 1 View  Result Date: 06/22/2017 CLINICAL DATA:  Generalized weakness and shortness of breath since  early this morning. Bradycardia. EXAM: CHEST 1 VIEW COMPARISON:  Chest CT 06/25/2004 FINDINGS: The heart is upper limits of normal in size given the AP projection and portable technique. There is tortuosity of the thoracic aorta. The pulmonary hila appear normal. No acute pulmonary findings. External pacer paddles are noted. Mild eventration of the right hemidiaphragm. The bony thorax is intact. IMPRESSION: No acute cardiopulmonary findings. Electronically Signed   By: Rudie Meyer M.D.   On: 06/22/2017 10:35     IMPRESSION AND PLAN:   *Bradycardia with type II block and periods of complete heart block. Vision will be admitted to stepdown unit.  Critically ill.  Stop clonidine and metoprolol. Discussed with Dr. Vennie Homans.  If no improvement will need pacemaker tomorrow morning. Ordered atropine as needed.  Patient continues to have symptoms with weakness and dizziness. Heart rate presently in low 30s.  *Hypertension.  Continue amlodipine.  Hold clonidine and metoprolol. Will replace with hydralazine. Has allergy to iron, ACE inhibitors and hydrochlorothiazide.  *Diabetes mellitus.  Sliding scale insulin.  *CKD stage III is stable  *DVT prophylaxis with heparin  All the records are reviewed and case discussed with ED provider. Management plans discussed with the patient, family and they are in agreement.  CODE STATUS: FULL CODE  TOTAL TIME TAKING CARE OF THIS PATIENT: 40 minutes.   Tamecca Artiga R Koal Eslinger M.D  on 06/22/2017 at 12:09 PM  Between 7am to 6pm - Pager - [phone]  After 6pm go to [email or web address] - password [email or web address] [facility]  [facility]  Office  [phone]  CC: Primary care physician; Leanna Sato, MD  Note: This dictation was prepared with Dragon dictation along with smaller phrase technology. Any transcriptional errors that result from this process are unintentional.

## 2017-06-22 NOTE — ED Triage Notes (Signed)
Pt c/o mid abd pain with generalized weakness and SOB since early this morning..Marland Kitchen

## 2017-06-22 NOTE — CV Procedure (Signed)
Temporary pacemaker Indication heart block Referring physician Dr. Mayford KnifeWilliams in the emergency room Primary cardiologist Dr. Darrold Andersen Primary Dr. Darreld McleanLinda Andersen . Patient was brought from the emergency room because of heart block for temporary pacemaker insertion.  Patient was placed on the cath table 20 cc of 1% lidocaine was used to anesthetize the right groin.  A 7 French sheath was inserted into the right femoral vein in the usual fashion.  A 5 French pacer was floated into the right ventricle.  Temporary pacemaker was set at a rate of 50 output of 2 sensitivity of 2.  Patient tolerated procedure well there was no significant bleeding area was bandaged and secured.. Patient was then transported to the intensive care unit bed 11. Conclusion Successful placement of temporary pacemaker from right groin Plan proceed with permanent pacemaker in 24-48 hours.  Case was [facility].

## 2017-06-23 ENCOUNTER — Encounter: Payer: Self-pay | Admitting: Internal Medicine

## 2017-06-23 ENCOUNTER — Inpatient Hospital Stay: Payer: Medicare HMO

## 2017-06-23 ENCOUNTER — Encounter
Admission: EM | Disposition: A | Discharge status: Home / Self Care | Payer: Self-pay | Source: Home / Self Care | Attending: Internal Medicine

## 2017-06-23 ENCOUNTER — Other Ambulatory Visit: Payer: Self-pay

## 2017-06-23 ENCOUNTER — Inpatient Hospital Stay: Payer: Medicare HMO | Admitting: Certified Registered"

## 2017-06-23 ENCOUNTER — Inpatient Hospital Stay
Admit: 2017-06-23 | Discharge: 2017-06-23 | Disposition: A | Discharge status: Home / Self Care | Payer: Medicare HMO | Attending: Internal Medicine | Admitting: Internal Medicine

## 2017-06-23 HISTORY — PX: PACEMAKER LEAD REMOVAL: SHX5064

## 2017-06-23 HISTORY — PX: PACEMAKER INSERTION: SHX728

## 2017-06-23 LAB — BASIC METABOLIC PANEL
ANION GAP: 9 (ref 5–15)
BUN: 16 mg/dL (ref 6–20)
CALCIUM: 9.1 mg/dL (ref 8.9–10.3)
CO2: 24 mmol/L (ref 22–32)
Chloride: 109 mmol/L (ref 101–111)
Creatinine, Ser: 1.3 mg/dL — ABNORMAL HIGH (ref 0.61–1.24)
GFR calc non Af Amer: 54 mL/min — ABNORMAL LOW (ref >60)
Glucose, Bld: 136 mg/dL — ABNORMAL HIGH (ref 65–99)
POTASSIUM: 3.8 mmol/L (ref 3.5–5.1)
SODIUM: 142 mmol/L (ref 135–145)

## 2017-06-23 LAB — CBC
HCT: 38.4 % — ABNORMAL LOW (ref 40.0–52.0)
HEMOGLOBIN: 12.8 g/dL — AB (ref 13.0–18.0)
MCH: 28.4 pg (ref 26.0–34.0)
MCHC: 33.4 g/dL (ref 32.0–36.0)
MCV: 85 fL (ref 80.0–100.0)
PLATELETS: 199 10*3/uL (ref 150–440)
RBC: 4.52 MIL/uL (ref 4.40–5.90)
RDW: 14.5 % (ref 11.5–14.5)
WBC: 7.5 10*3/uL (ref 3.8–10.6)

## 2017-06-23 LAB — GLUCOSE, CAPILLARY
GLUCOSE-CAPILLARY: 144 mg/dL — AB (ref 65–99)
GLUCOSE-CAPILLARY: 124 mg/dL — AB (ref 65–99)
GLUCOSE-CAPILLARY: 142 mg/dL — AB (ref 65–99)
GLUCOSE-CAPILLARY: 227 mg/dL — AB (ref 65–99)
Glucose-Capillary: 130 mg/dL — ABNORMAL HIGH (ref 65–99)

## 2017-06-23 LAB — ECHOCARDIOGRAM COMPLETE
HEIGHTINCHES: 72 in
Weight: 3440 oz

## 2017-06-23 SURGERY — REMOVAL, ELECTRODE LEAD, CARDIAC PACEMAKER, WITHOUT REPLACEMENT
Anesthesia: Monitor Anesthesia Care | Wound class: Clean

## 2017-06-23 SURGERY — INSERTION, CARDIAC PACEMAKER
Anesthesia: General | Laterality: Left

## 2017-06-23 SURGERY — INSERTION, CARDIAC PACEMAKER
Anesthesia: General | Site: Chest | Laterality: Left | Wound class: Clean

## 2017-06-23 MED ORDER — PROPOFOL 500 MG/50ML IV EMUL
INTRAVENOUS | Status: DC | PRN
  Administered 2017-06-23: 15 ug/kg/min via INTRAVENOUS

## 2017-06-23 MED ORDER — ONDANSETRON HCL 4 MG/2ML IJ SOLN: 4.0000 mg | Freq: Four times a day (QID) | INTRAMUSCULAR | Status: DC | PRN

## 2017-06-23 MED ORDER — LIDOCAINE 1 % OPTIME INJ - NO CHARGE
INTRAMUSCULAR | Status: DC | PRN
  Administered 2017-06-23: 30 mL

## 2017-06-23 MED ORDER — DEXTROSE 5 % IV SOLN
Freq: Four times a day (QID) | INTRAVENOUS | Status: AC
  Administered 2017-06-23 – 2017-06-24 (×3): via INTRAVENOUS
  Filled 2017-06-23 (×3): qty 10

## 2017-06-23 MED ORDER — LIDOCAINE HCL (PF) 2 % IJ SOLN
INTRAMUSCULAR | Status: AC
  Filled 2017-06-23: qty 10

## 2017-06-23 MED ORDER — PROPOFOL 500 MG/50ML IV EMUL
INTRAVENOUS | Status: AC
  Filled 2017-06-23: qty 50

## 2017-06-23 MED ORDER — LIDOCAINE 1 % OPTIME INJ - NO CHARGE
INTRAMUSCULAR | Status: DC | PRN
  Administered 2017-06-23: 18 mL

## 2017-06-23 MED ORDER — ONDANSETRON HCL 4 MG/2ML IJ SOLN: 4.0000 mg | Freq: Once | INTRAMUSCULAR | Status: DC | PRN

## 2017-06-23 MED ORDER — HYDRALAZINE HCL 20 MG/ML IJ SOLN
INTRAMUSCULAR | Status: AC
  Administered 2017-06-23: 10 mg via INTRAVENOUS
  Filled 2017-06-23: qty 1

## 2017-06-23 MED ORDER — ACETAMINOPHEN 325 MG PO TABS: 325.0000 mg | ORAL_TABLET | ORAL | Status: DC | PRN

## 2017-06-23 MED ORDER — MIDAZOLAM HCL 2 MG/2ML IJ SOLN
INTRAMUSCULAR | Status: DC | PRN
  Administered 2017-06-23: 1 mg via INTRAVENOUS

## 2017-06-23 MED ORDER — HYDRALAZINE HCL 20 MG/ML IJ SOLN
10.0000 mg | Freq: Once | INTRAMUSCULAR | Status: AC
  Administered 2017-06-23: 10 mg via INTRAVENOUS

## 2017-06-23 MED ORDER — SODIUM CHLORIDE 0.9 % IR SOLN
Status: DC | PRN
  Administered 2017-06-23: 400 mL

## 2017-06-23 MED ORDER — ORAL CARE MOUTH RINSE
15.0000 mL | Freq: Two times a day (BID) | OROMUCOSAL | Status: DC
  Administered 2017-06-23 – 2017-06-25 (×5): 15 mL via OROMUCOSAL

## 2017-06-23 MED ORDER — PROPOFOL 500 MG/50ML IV EMUL
INTRAVENOUS | Status: DC | PRN
  Administered 2017-06-23: 25 ug/kg/min via INTRAVENOUS

## 2017-06-23 MED ORDER — LIDOCAINE HCL (CARDIAC) 20 MG/ML IV SOLN
INTRAVENOUS | Status: DC | PRN
  Administered 2017-06-23: 100 mg via INTRAVENOUS

## 2017-06-23 MED ORDER — MIDAZOLAM HCL 2 MG/2ML IJ SOLN
INTRAMUSCULAR | Status: AC
  Filled 2017-06-23: qty 2

## 2017-06-23 MED ORDER — CEFAZOLIN SODIUM-DEXTROSE 1-4 GM/50ML-% IV SOLN: 1.0000 g | Freq: Four times a day (QID) | INTRAVENOUS | Status: DC

## 2017-06-23 MED ORDER — ONDANSETRON HCL 4 MG/2ML IJ SOLN
INTRAMUSCULAR | Status: DC | PRN
  Administered 2017-06-23: 4 mg via INTRAVENOUS

## 2017-06-23 MED ORDER — SODIUM CHLORIDE 0.9 % IR SOLN
Status: DC | PRN
  Administered 2017-06-23: 300 mL

## 2017-06-23 MED ORDER — FENTANYL CITRATE (PF) 100 MCG/2ML IJ SOLN: 25.0000 ug | INTRAMUSCULAR | Status: DC | PRN

## 2017-06-23 MED ORDER — PROPOFOL 10 MG/ML IV BOLUS
INTRAVENOUS | Status: AC
  Filled 2017-06-23: qty 20

## 2017-06-23 MED ORDER — SODIUM CHLORIDE 0.9 % IV SOLN
INTRAVENOUS | Status: DC | PRN
Start: 2017-06-23 — End: 2017-06-23
  Administered 2017-06-23: 13:00:00 via INTRAVENOUS

## 2017-06-23 MED ORDER — SODIUM CHLORIDE 0.9 % IV SOLN
INTRAVENOUS | Status: DC | PRN
  Administered 2017-06-23: 15:00:00 via INTRAVENOUS

## 2017-06-23 MED ORDER — FENTANYL CITRATE (PF) 100 MCG/2ML IJ SOLN
INTRAMUSCULAR | Status: AC
  Administered 2017-06-23: 25 ug via INTRAVENOUS
  Filled 2017-06-23: qty 2

## 2017-06-23 MED ORDER — PROPOFOL 10 MG/ML IV BOLUS
INTRAVENOUS | Status: DC | PRN
  Administered 2017-06-23: 30 mg via INTRAVENOUS

## 2017-06-23 MED ORDER — FENTANYL CITRATE (PF) 100 MCG/2ML IJ SOLN
25.0000 ug | INTRAMUSCULAR | Status: DC | PRN
  Administered 2017-06-23 (×4): 25 ug via INTRAVENOUS

## 2017-06-23 MED ORDER — FENTANYL CITRATE (PF) 100 MCG/2ML IJ SOLN
INTRAMUSCULAR | Status: AC
  Filled 2017-06-23: qty 2

## 2017-06-23 SURGICAL SUPPLY — 37 items
BAG DECANTER FOR FLEXI CONT (MISCELLANEOUS) ×3 IMPLANT
BRUSH SCRUB EZ  4% CHG (MISCELLANEOUS) ×2
BRUSH SCRUB EZ 4% CHG (MISCELLANEOUS) ×1 IMPLANT
CABLE SURG 12 DISP A/V CHANNEL (MISCELLANEOUS) ×3 IMPLANT
CANISTER SUCT 1200ML W/VALVE (MISCELLANEOUS) ×3 IMPLANT
CHLORAPREP W/TINT 26ML (MISCELLANEOUS) ×3 IMPLANT
COVER LIGHT HANDLE STERIS (MISCELLANEOUS) ×6 IMPLANT
COVER MAYO STAND STRL (DRAPES) ×3 IMPLANT
DRAPE C-ARM XRAY 36X54 (DRAPES) ×3 IMPLANT
DRSG TEGADERM 4X4.75 (GAUZE/BANDAGES/DRESSINGS) ×3 IMPLANT
DRSG TELFA 4X3 1S NADH ST (GAUZE/BANDAGES/DRESSINGS) ×3 IMPLANT
ELECT REM PT RETURN 9FT ADLT (ELECTROSURGICAL) ×3
ELECTRODE REM PT RTRN 9FT ADLT (ELECTROSURGICAL) ×1 IMPLANT
GLOVE BIO SURGEON STRL SZ7.5 (GLOVE) ×3 IMPLANT
GLOVE BIO SURGEON STRL SZ8 (GLOVE) ×3 IMPLANT
GOWN STRL REUS W/ TWL LRG LVL3 (GOWN DISPOSABLE) ×1 IMPLANT
GOWN STRL REUS W/ TWL XL LVL3 (GOWN DISPOSABLE) ×1 IMPLANT
GOWN STRL REUS W/TWL LRG LVL3 (GOWN DISPOSABLE) ×2
GOWN STRL REUS W/TWL XL LVL3 (GOWN DISPOSABLE) ×2
IMMOBILIZER SHDR MD LX WHT (SOFTGOODS) IMPLANT
IMMOBILIZER SHDR XL LX WHT (SOFTGOODS) ×3 IMPLANT
INTRO PACEMAKR LEAD 9FR 13CM (INTRODUCER) ×3
INTRO PACEMKR SHEATH II 7FR (MISCELLANEOUS) ×6
INTRODUCER PACEMKR LD 9FR 13CM (INTRODUCER) ×1 IMPLANT
INTRODUCER PACEMKR SHTH II 7FR (MISCELLANEOUS) ×2 IMPLANT
IPG PACE AZUR XT DR MRI W1DR01 (Pacemaker) ×1 IMPLANT
IV NS 500ML (IV SOLUTION) ×2
IV NS 500ML BAXH (IV SOLUTION) ×1 IMPLANT
KIT TURNOVER KIT A (KITS) ×3 IMPLANT
LABEL OR SOLS (LABEL) ×3 IMPLANT
LEAD CAPSURE NOVUS 5076-52CM (Lead) ×3 IMPLANT
LEAD CAPSURE NOVUS 5076-58CM (Lead) ×3 IMPLANT
MARKER SKIN DUAL TIP RULER LAB (MISCELLANEOUS) ×3 IMPLANT
PACE AZURE XT DR MRI W1DR01 (Pacemaker) ×3 IMPLANT
PACK PACE INSERTION (MISCELLANEOUS) ×3 IMPLANT
PAD ONESTEP ZOLL R SERIES ADT (MISCELLANEOUS) ×3 IMPLANT
SUT SILK 0 SH 30 (SUTURE) ×9 IMPLANT

## 2017-06-23 SURGICAL SUPPLY — 41 items
BAG DECANTER FOR FLEXI CONT (MISCELLANEOUS) ×3 IMPLANT
BLADE PHOTON ILLUMINATED (MISCELLANEOUS) ×3 IMPLANT
BRUSH SCRUB EZ  4% CHG (MISCELLANEOUS) ×2
BRUSH SCRUB EZ 4% CHG (MISCELLANEOUS) ×1 IMPLANT
CABLE SURG 12 DISP A/V CHANNEL (MISCELLANEOUS) ×3 IMPLANT
CANISTER SUCT 1200ML W/VALVE (MISCELLANEOUS) ×3 IMPLANT
CHLORAPREP W/TINT 26ML (MISCELLANEOUS) ×3 IMPLANT
COVER LIGHT HANDLE STERIS (MISCELLANEOUS) ×6 IMPLANT
COVER MAYO STAND STRL (DRAPES) ×3 IMPLANT
DRAPE C-ARM XRAY 36X54 (DRAPES) ×3 IMPLANT
DRSG TEGADERM 4X4.75 (GAUZE/BANDAGES/DRESSINGS) ×3 IMPLANT
DRSG TELFA 4X3 1S NADH ST (GAUZE/BANDAGES/DRESSINGS) ×3 IMPLANT
ELECT REM PT RETURN 9FT ADLT (ELECTROSURGICAL) ×3
ELECTRODE REM PT RTRN 9FT ADLT (ELECTROSURGICAL) ×1 IMPLANT
GLOVE BIO SURGEON STRL SZ7.5 (GLOVE) ×3 IMPLANT
GLOVE BIO SURGEON STRL SZ8 (GLOVE) ×3 IMPLANT
GOWN STRL REUS W/ TWL LRG LVL3 (GOWN DISPOSABLE) ×1 IMPLANT
GOWN STRL REUS W/ TWL XL LVL3 (GOWN DISPOSABLE) ×1 IMPLANT
GOWN STRL REUS W/TWL LRG LVL3 (GOWN DISPOSABLE) ×2
GOWN STRL REUS W/TWL XL LVL3 (GOWN DISPOSABLE) ×2
IMMOBILIZER SHDR MD LX WHT (SOFTGOODS) IMPLANT
IMMOBILIZER SHDR XL LX WHT (SOFTGOODS) IMPLANT
INTRO PACEMKR SHEATH II 7FR (MISCELLANEOUS) ×3
INTRODUCER PACEMKR SHTH II 7FR (MISCELLANEOUS) ×1 IMPLANT
IV NS 500ML (IV SOLUTION) ×2
IV NS 500ML BAXH (IV SOLUTION) ×1 IMPLANT
KIT LEAD ACCESSORY 6056 PINCH (MISCELLANEOUS) ×3 IMPLANT
KIT TURNOVER KIT A (KITS) ×3 IMPLANT
KIT WRENCH (KITS) ×3 IMPLANT
LABEL OR SOLS (LABEL) ×3 IMPLANT
LEAD CAPSURE NOVUS 5076-58CM (Lead) ×3 IMPLANT
MARKER SKIN DUAL TIP RULER LAB (MISCELLANEOUS) ×3 IMPLANT
NEEDLE FILTER BLUNT 18X 1/2SAF (NEEDLE) ×2
NEEDLE FILTER BLUNT 18X1 1/2 (NEEDLE) ×1 IMPLANT
PACEMAKER STYLET BLUE (MISCELLANEOUS) ×3 IMPLANT
PACEMAKER STYLET GRAY (MISCELLANEOUS) ×2
PACEMAKER STYLET GRAY 58 (MISCELLANEOUS) ×1 IMPLANT
PACK PACE INSERTION (MISCELLANEOUS) ×3 IMPLANT
PAD STATPAD (MISCELLANEOUS) ×3 IMPLANT
SUT SILK 0 SH 30 (SUTURE) ×9 IMPLANT
SYR 10ML LL (SYRINGE) ×3 IMPLANT

## 2017-06-23 NOTE — Transfer of Care (Signed)
Immediate Anesthesia Transfer of Care Note  Patient: Eric Andersen  Procedure(s) Performed: INSERTION PACEMAKER INITIAL INSERT-DUAL CHAMBER (Left Chest)  Patient Location: PACU  Anesthesia Type:General  Level of Consciousness: awake, alert , oriented and patient cooperative  Airway & Oxygen Therapy: Patient Spontanous Breathing and Patient connected to face mask oxygen  Post-op Assessment: Report given to RN and Post -op Vital signs reviewed and stable  Post vital signs: Reviewed and stable  Last Vitals:  Vitals:   06/23/17 0900 06/23/17 1345  BP: 129/75 (!) 123/93  Pulse: (!) 50 71  Resp: 18 17  Temp:    SpO2: 98% 95%    Last Pain:  Vitals:   06/23/17 0800  TempSrc: Axillary  PainSc:          Complications: No apparent anesthesia complications

## 2017-06-23 NOTE — Anesthesia Postprocedure Evaluation (Signed)
Anesthesia Post Note  Patient: Eric Andersen  Procedure(s) Performed: PACEMAKER LEAD REVISION (N/A )  Patient location during evaluation: PACU Anesthesia Type: MAC Level of consciousness: awake and alert and oriented Pain management: pain level controlled Vital Signs Assessment: post-procedure vital signs reviewed and stable Respiratory status: spontaneous breathing Cardiovascular status: blood pressure returned to baseline Anesthetic complications: no     Last Vitals:  Vitals:   06/23/17 1445 06/23/17 1558  BP: (!) 145/111 137/81  Pulse: 84 75  Resp: (!) 24 13  Temp:  37.8 C  SpO2: 97% 99%    Last Pain:  Vitals:   06/23/17 0800  TempSrc: Axillary  PainSc:                  [name]

## 2017-06-23 NOTE — Anesthesia Post-op Follow-up Note (Signed)
Anesthesia QCDR form completed.        

## 2017-06-23 NOTE — Progress Notes (Signed)
Pt to OR.

## 2017-06-23 NOTE — Consult Note (Signed)
Reason for Consult: Bradycardia heart block Referring Physician: [name] primary Dr. Darvin Neighbours hospitalist Cardiologist Dr. Karen Kays Eric Andersen is an 72 y.o. male.  HPI: Patient presents with a history of hypertension diabetes chronic renal insufficiency obstructive sleep apnea previous CVA complained of weakness fatigue wife checked his heart rate and it was low in the 30s.  The patient called her primary doctor who told her to go to urgent care.  After evaluation in urgent care they referred him to emergency room.  Initial EKG suggested complete A-V dissociation severe heart block and bradycardia.  Patient had been on metoprolol and clonidine which may have contributed patient felt weak and fatigued lightheaded dizzy so came to the emergency room and subsequently was advised to be admitted with episodes of pauses and runs of just P waves no ventricular activity for several seconds.  Past Medical History:  Diagnosis Date  . Diabetes mellitus without complication ([place])   . Hypercholesterolemia   . Hypertension   . Stroke [facility])     Past Surgical History:  Procedure Laterality Date  . CARDIAC CATHETERIZATION    . TEMPORARY PACEMAKER N/A 06/22/2017   Procedure: TEMPORARY PACEMAKER;  Surgeon: Yolonda Kida, MD;  Location: [facility] [facility];  Service: Cardiovascular;  Laterality: N/A;    No family history on file.  Social History:  reports that he has quit smoking. he has never used smokeless tobacco. He reports that he does not drink alcohol. His drug history is not on file.  Allergies:  Allergies  Allergen Reactions  . Diovan [Valsartan] Cough  . Ace Inhibitors Rash  . Hydrochlorothiazide Rash    Medications: I have reviewed the patient's current medications.  Results for orders placed or performed during the hospital encounter of 06/22/17 (from the past 48 hour(s))  Carbamazepine level, total     Status: None   Collection Time: 06/22/17  9:53 AM  Result Value Ref  Range   Carbamazepine Lvl 6.1 4.0 - 12.0 ug/mL    Comment: Performed at [facility], [address] [place], [place] [address]  Magnesium     Status: None   Collection Time: 06/22/17  9:53 AM  Result Value Ref Range   Magnesium 1.8 1.7 - 2.4 mg/dL    Comment: Performed at [facility], [address] [place], [place] [address]  CBC with Differential/Platelet     Status: None   Collection Time: 06/22/17  9:53 AM  Result Value Ref Range   WBC 5.6 3.8 - 10.6 K/uL   RBC 4.71 4.40 - 5.90 MIL/uL   Hemoglobin 13.3 13.0 - 18.0 g/dL   HCT 40.3 40.0 - 52.0 %   MCV 85.6 80.0 - 100.0 fL   MCH 28.2 26.0 - 34.0 pg   MCHC 33.0 32.0 - 36.0 g/dL   RDW 14.4 11.5 - 14.5 %   Platelets 219 150 - 440 K/uL   Neutrophils Relative % 72 %   Neutro Abs 4.1 1.4 - 6.5 K/uL   Lymphocytes Relative 19 %   Lymphs Abs 1.0 1.0 - 3.6 K/uL   Monocytes Relative 6 %   Monocytes Absolute 0.4 0.2 - 1.0 K/uL   Eosinophils Relative 3 %   Eosinophils Absolute 0.2 0 - 0.7 K/uL   Basophils Relative 0 %   Basophils Absolute 0.0 0 - 0.1 K/uL    Comment: Performed at [facility], [address] [place], [place] [address]  Comprehensive metabolic panel     Status: Abnormal  Collection Time: 06/22/17  9:53 AM  Result Value Ref Range   Sodium 140 135 - 145 mmol/L   Potassium 3.7 3.5 - 5.1 mmol/L   Chloride 104 101 - 111 mmol/L   CO2 26 22 - 32 mmol/L   Glucose, Bld 233 (H) 65 - 99 mg/dL   BUN 17 6 - 20 mg/dL   Creatinine, Ser 1.59 (H) 0.61 - 1.24 mg/dL   Calcium 9.7 8.9 - 10.3 mg/dL   Total Protein 7.4 6.5 - 8.1 g/dL   Albumin 4.0 3.5 - 5.0 g/dL   AST 87 (H) 15 - 41 U/L   ALT 93 (H) 17 - 63 U/L   Alkaline Phosphatase 54 38 - 126 U/L   Total Bilirubin 0.4 0.3 - 1.2 mg/dL   GFR calc non Af Amer 43 (L) >60 mL/min   GFR calc Af Amer 49 (L) >60 mL/min    Comment: (NOTE) The eGFR has been calculated using the CKD EPI equation. This calculation has not been validated in all clinical  situations. eGFR's persistently <60 mL/min signify possible Chronic Kidney Disease.    Anion gap 10 5 - 15    Comment: Performed at [facility], [address] [place], [place] [address]  Troponin I     Status: None   Collection Time: 06/22/17  9:53 AM  Result Value Ref Range   Troponin I <0.03 <0.03 ng/mL    Comment: Performed at [facility], [address] [place], [place] [address]  Protime-INR     Status: None   Collection Time: 06/22/17  9:53 AM  Result Value Ref Range   Prothrombin Time 12.1 11.4 - 15.2 seconds   INR 0.90     Comment: Performed at [facility], [address] [place], [place] [address]  APTT     Status: None   Collection Time: 06/22/17  9:53 AM  Result Value Ref Range   aPTT 26 24 - 36 seconds    Comment: Performed at [facility], [address] [place], [place] [address]  TSH     Status: None   Collection Time: 06/22/17  9:53 AM  Result Value Ref Range   TSH 1.773 0.350 - 4.500 uIU/mL    Comment: Performed by a 3rd Generation assay with a functional sensitivity of <=0.01 uIU/mL. Performed at [facility], [address] [place], [place] [address]   Hemoglobin A1c     Status: Abnormal   Collection Time: 06/22/17  9:53 AM  Result Value Ref Range   Hgb A1c MFr Bld 6.8 (H) 4.8 - 5.6 %    Comment: (NOTE) Pre diabetes:          5.7%-6.4% Diabetes:              >6.4% Glycemic control for   <7.0% adults with diabetes    Mean Plasma Glucose 148.46 mg/dL    Comment: Performed at [facility] [address], [place], [place] [address]  MRSA PCR Screening     Status: None   Collection Time: 06/22/17  3:35 PM  Result Value Ref Range   MRSA by PCR NEGATIVE NEGATIVE    Comment:        The GeneXpert MRSA Assay (FDA approved for NASAL specimens only), is one component of a comprehensive MRSA colonization surveillance program. It is not intended to diagnose MRSA infection nor to guide  or monitor treatment for MRSA infections. Performed at [facility], [address] [place], [place] [address]   Glucose, capillary     Status: Abnormal   Collection Time: 06/22/17  3:52 PM  Result Value Ref Range   Glucose-Capillary 132 (H) 65 - 99 mg/dL  Troponin I     Status: Abnormal   Collection Time: 06/22/17  3:58 PM  Result Value Ref Range   Troponin I 0.06 (HH) <0.03 ng/mL    Comment: CRITICAL RESULT CALLED TO, READ BACK BY AND VERIFIED WITH CHERYL GREEN AT 1658 06/22/2017 BY TFK. Performed at [facility], [address] [place], [place] [address]   Glucose, capillary     Status: Abnormal   Collection Time: 06/22/17  4:56 PM  Result Value Ref Range   Glucose-Capillary 120 (H) 65 - 99 mg/dL   Comment 1 Document in Chart   Troponin I     Status: Abnormal   Collection Time: 06/22/17 10:25 PM  Result Value Ref Range   Troponin I 0.06 (HH) <0.03 ng/mL    Comment: CRITICAL VALUE NOTED. VALUE IS CONSISTENT WITH PREVIOUSLY REPORTED/CALLED VALUE [facility] Performed at [facility], [address] [place], [place] [address]   Glucose, capillary     Status: Abnormal   Collection Time: 06/22/17 10:26 PM  Result Value Ref Range   Glucose-Capillary 149 (H) 65 - 99 mg/dL  Basic metabolic panel     Status: Abnormal   Collection Time: 06/23/17  4:52 AM  Result Value Ref Range   Sodium 142 135 - 145 mmol/L   Potassium 3.8 3.5 - 5.1 mmol/L   Chloride 109 101 - 111 mmol/L   CO2 24 22 - 32 mmol/L   Glucose, Bld 136 (H) 65 - 99 mg/dL   BUN 16 6 - 20 mg/dL   Creatinine, Ser 1.30 (H) 0.61 - 1.24 mg/dL   Calcium 9.1 8.9 - 10.3 mg/dL   GFR calc non Af Amer 54 (L) >60 mL/min   GFR calc Af Amer >60 >60 mL/min    Comment: (NOTE) The eGFR has been calculated using the CKD EPI equation. This calculation has not been validated in all clinical situations. eGFR's persistently <60 mL/min signify possible Chronic Kidney Disease.    Anion gap 9 5 - 15    Comment:  Performed at [facility], [address] [place], [place] [address]  CBC     Status: Abnormal   Collection Time: 06/23/17  4:52 AM  Result Value Ref Range   WBC 7.5 3.8 - 10.6 K/uL   RBC 4.52 4.40 - 5.90 MIL/uL   Hemoglobin 12.8 (L) 13.0 - 18.0 g/dL   HCT 38.4 (L) 40.0 - 52.0 %   MCV 85.0 80.0 - 100.0 fL   MCH 28.4 26.0 - 34.0 pg   MCHC 33.4 32.0 - 36.0 g/dL   RDW 14.5 11.5 - 14.5 %   Platelets 199 150 - 440 K/uL    Comment: Performed at [facility], [address] [place], [place] [address]  Glucose, capillary     Status: Abnormal   Collection Time: 06/23/17  7:28 AM  Result Value Ref Range   Glucose-Capillary 144 (H) 65 - 99 mg/dL   Comment 1 Notify RN     Dg Chest 1 View  Result Date: 06/22/2017 CLINICAL DATA:  Generalized weakness and shortness of breath since early this morning. Bradycardia. EXAM: CHEST 1 VIEW COMPARISON:  Chest CT 06/25/2004 FINDINGS: The heart is upper limits of normal in size given the AP projection and portable technique. There is tortuosity  of the thoracic aorta. The pulmonary hila appear normal. No acute pulmonary findings. External pacer paddles are noted. Mild eventration of the right hemidiaphragm. The bony thorax is intact. IMPRESSION: No acute cardiopulmonary findings. Electronically Signed   By: Marijo Sanes M.D.   On: 06/22/2017 10:35    Review of Systems  Constitutional: Positive for malaise/fatigue.  HENT: Positive for congestion.   Eyes: Negative.   Respiratory: Positive for shortness of breath.   Cardiovascular: Positive for palpitations.  Gastrointestinal: Negative.   Genitourinary: Negative.   Musculoskeletal: Positive for back pain and myalgias.  Skin: Negative.   Neurological: Positive for dizziness and weakness.  Endo/Heme/Allergies: Negative.   Psychiatric/Behavioral: Negative.    Blood pressure 129/66, pulse (!) 49, temperature 98.4 F (36.9 C), temperature source Oral, resp. rate (!) 24, height 6' (1.829  m), weight 215 lb (97.5 kg), SpO2 97 %. Physical Exam  Nursing note and vitals reviewed. Constitutional: He is oriented to person, place, and time. He appears well-developed and well-nourished.  HENT:  Head: Normocephalic and atraumatic.  Eyes: Conjunctivae and EOM are normal. Pupils are equal, round, and reactive to light.  Neck: Normal range of motion. Neck supple.  Cardiovascular: S1 normal, S2 normal and normal pulses. An irregularly irregular rhythm present. Bradycardia present.  Murmur heard. Respiratory: Effort normal and breath sounds normal.  GI: Soft. Bowel sounds are normal.  Musculoskeletal: Normal range of motion.  Neurological: He is alert and oriented to person, place, and time. He has normal reflexes.  Skin: Skin is warm and dry.  Psychiatric: He has a normal mood and affect.    Assessment/Plan: Bradycardia Heart block Diabetes Hypertension Hyperlipidemia Obstructive sleep apnea History of CVA Chronic renal insufficiency stage III History of smoking but quit Weakness . Plan Agree with admission to ICU Recommend echocardiogram for assessment of left ventricular function Proceed with temporary pacemaker Consider permanent pacemaker placement Continue diabetes management insulin sliding scale Hypertension management with amlodipine will hold clonidine and metoprolol for now Referred patient to nephrology for renal insufficiency Follow-up treatment generalized weakness probably related to bradycardia DVT prophylaxis  Taylor Spilde D Othal Kubitz 06/23/2017, 7:58 AM

## 2017-06-23 NOTE — Progress Notes (Signed)
*  PRELIMINARY RESULTS* Echocardiogram 2D Echocardiogram has been performed.  Cristela BlueHege, Rommel Hogston 06/23/2017, 9:27 AM

## 2017-06-23 NOTE — Progress Notes (Signed)
   06/23/17 2000  Clinical Encounter Type  Visited With Patient;Family;Patient and family together  Visit Type Initial;Spiritual support (HCPOA)  Referral From Nurse  Spiritual Encounters  Spiritual Needs Literature  HCPOA/AD materials dropped off with patient.  CH reviewed materials with patient and his wife.

## 2017-06-23 NOTE — Transfer of Care (Signed)
Immediate Anesthesia Transfer of Care Note  Patient: Eric Andersen  Procedure(s) Performed: PACEMAKER LEAD REVISION (N/A )  Patient Location: PACU  Anesthesia Type:MAC  Level of Consciousness: awake, alert , oriented and patient cooperative  Airway & Oxygen Therapy: Patient Spontanous Breathing and Patient connected to face mask oxygen  Post-op Assessment: Report given to RN, Post -op Vital signs reviewed and stable and Patient moving all extremities X 4  Post vital signs: Reviewed and stable  Last Vitals:  Vitals:   06/23/17 1430 06/23/17 1445  BP: (!) 144/71 (!) 145/111  Pulse: 77 84  Resp: (!) 25 (!) 24  Temp:    SpO2: 94% 97%    Last Pain:  Vitals:   06/23/17 0800  TempSrc: Axillary  PainSc:          Complications: No apparent anesthesia complications

## 2017-06-23 NOTE — Anesthesia Postprocedure Evaluation (Signed)
Anesthesia Post Note  Patient: Eric Andersen  Procedure(s) Performed: INSERTION PACEMAKER INITIAL INSERT-DUAL CHAMBER (Left Chest)  Patient location during evaluation: PACU Anesthesia Type: General Level of consciousness: awake and alert and oriented Pain management: pain level controlled Vital Signs Assessment: post-procedure vital signs reviewed and stable Respiratory status: spontaneous breathing Cardiovascular status: blood pressure returned to baseline Anesthetic complications: no     Last Vitals:  Vitals:   06/23/17 1445 06/23/17 1558  BP: (!) 145/111 137/81  Pulse: 84 75  Resp: (!) 24 13  Temp:  37.8 C  SpO2: 97% 99%    Last Pain:  Vitals:   06/23/17 0800  TempSrc: Axillary  PainSc:                  Krislynn Gronau

## 2017-06-23 NOTE — Progress Notes (Signed)
Patient with high grade AV block, requires pacemaker.

## 2017-06-23 NOTE — Progress Notes (Signed)
Dr. Darrold JunkerParaschos at bedside, patient hooked up to defibrillator, patient having intermittent chest pain, on 4L [place]. Patient signed consent to go back to OR.

## 2017-06-23 NOTE — Progress Notes (Signed)
Patient ID: Eric LawsRaymond S Mcglory, male   DOB: 1946/03/13, 72 y.o.   MRN: [id number] Pt has gone to the OR for Pacemaker placement.

## 2017-06-23 NOTE — Op Note (Signed)
[facility]   06/23/2017                     3:52 PM  PATIENT:  Eric Andersen    PRE-OPERATIVE DIAGNOSIS:  n/a  POST-OPERATIVE DIAGNOSIS:  Same  PROCEDURE:  PACEMAKER LEAD REVISION  SURGEON:  Marcina MillardAlexander Sarafina Puthoff, MD    ANESTHESIA:     PREOPERATIVE INDICATIONS:  Eric Andersen is a  72 y.o. male with a diagnosis of n/a who failed conservative measures and elected for surgical management.    The risks benefits and alternatives were discussed with the patient preoperatively including but not limited to the risks of infection, bleeding, cardiopulmonary complications, the need for revision surgery, among others, and the patient was willing to proceed.   OPERATIVE PROCEDURE: Anesthesia was obtained 1% lidocaine.  The existing pacemaker generator was retrieved by blunt dissection.  Access was obtained to the left subclavian vein by fine-needle aspiration.  A new MRI compatible lead ( Medtronic [id number] ) was positioned into the right ventricular apical septum.  After proper thresholds were obtained the lead was sutured in place.  The existing ventricular lead was disconnected from the pacemaker and a new lead was positioned into the pacemaker.  The pacemaker pocket was irrigated with gentamicin solution.  The pacemaker generator was repositioned into the pocket and the pocket was closed with 2-0 and 4-0 Vicryl, respectively.  Steri-Strips and pressure dressing were applied.

## 2017-06-23 NOTE — Op Note (Signed)
[facility]   06/23/2017                     1:53 PM  PATIENT:  Eric Andersen    PRE-OPERATIVE DIAGNOSIS:  HEART BLOCK, BRADYCARDIA  POST-OPERATIVE DIAGNOSIS:  Same  PROCEDURE:  INSERTION PACEMAKER INITIAL INSERT-DUAL CHAMBER  SURGEON:  Marcina MillardAlexander Jolaine Fryberger, MD    ANESTHESIA:     PREOPERATIVE INDICATIONS:  Eric Andersen is a  72 y.o. male with a diagnosis of HEART BLOCK, BRADYCARDIA who failed conservative measures and elected for surgical management.    The risks benefits and alternatives were discussed with the patient preoperatively including but not limited to the risks of infection, bleeding, cardiopulmonary complications, the need for revision surgery, among others, and the patient was willing to proceed.   OPERATIVE PROCEDURE: The patient was brought to the operating room in fasting state.  Left pectoral region was prepped and draped in usual sterile manner.  Anesthesia was obtained 1% lidocaine locally.  A 6 cm incision was performed to the left pectoral region.  The pacemaker pocket was generated by electrocautery and blunt dissection.  Access was obtained to the left subclavian vein by fine-needle aspiration.  MRI compatible leads were positioned into the right ventricular apical septum ( Medtronic [id number] )  and right atrial appendage ( Medtronic [id number] ) under fluoroscopic guidance.  After proper thresholds were obtained the leads were sutured in place.  The pacemaker pocket was irrigated with gentamicin solution.  The leads were connected to a dual chamber MRI compatible rate responsive pacemaker generator ( Medtronic  [id number] ).  Pacemaker generator was positioned into the pocket and the pocket was closed with 2-0 and 4-0 Vicryl, respectively.  Steri-Strips and a pressure dressing were applied.  There were no periprocedural complications.  Postprocedure interrogation revealed appropriate dual-chamber atrial and ventricular sensing and pacing thresholds.

## 2017-06-23 NOTE — Progress Notes (Signed)
Patient ID: Eric Andersen, male   DOB: 1946-02-28, 72 y.o.   MRN: [id number] Pulmonary/critical care  Intensive care unit covering note. Patient with new onset complete heart block, status post transvenous pacemaker, pending permanent pacemaker this morning. Patient is awake and alert and hemodynamically stable without any issues. We'll be available to assist with management. Any issues arise.  Tora KindredJohn Summar Andersen, D.O.

## 2017-06-23 NOTE — Anesthesia Preprocedure Evaluation (Signed)
Anesthesia Evaluation  Patient identified by MRN, date of birth, ID band Patient awake    Reviewed: Allergy & Precautions, NPO status , Patient's Chart, lab work & pertinent test results  Airway Mallampati: II  TM Distance: >3 FB     Dental   Pulmonary former smoker,    Pulmonary exam normal        Cardiovascular hypertension, Normal cardiovascular exam     Neuro/Psych CVA negative psych ROS   GI/Hepatic negative GI ROS, Neg liver ROS,   Endo/Other  diabetes  Renal/GU negative Renal ROS  negative genitourinary   Musculoskeletal negative musculoskeletal ROS (+)   Abdominal Normal abdominal exam  (+)   Peds negative pediatric ROS (+)  Hematology negative hematology ROS (+)   Anesthesia Other Findings   Reproductive/Obstetrics                             Anesthesia Physical  Anesthesia Plan  ASA: III  Anesthesia Plan: MAC   Post-op Pain Management:    Induction: Intravenous  PONV Risk Score and Plan:   Airway Management Planned: Nasal Cannula  Additional Equipment:   Intra-op Plan:   Post-operative Plan:   Informed Consent: I have reviewed the patients History and Physical, chart, labs and discussed the procedure including the risks, benefits and alternatives for the proposed anesthesia with the patient or authorized representative who has indicated his/her understanding and acceptance.   Dental advisory given  Plan Discussed with: CRNA and Surgeon  Anesthesia Plan Comments:         Anesthesia Quick Evaluation

## 2017-06-23 NOTE — Anesthesia Preprocedure Evaluation (Signed)
Anesthesia Evaluation  Patient identified by MRN, date of birth, ID band Patient awake    Reviewed: Allergy & Precautions, NPO status , Patient's Chart, lab work & pertinent test results  Airway Mallampati: II  TM Distance: >3 FB     Dental   Pulmonary former smoker,    Pulmonary exam normal        Cardiovascular hypertension, Normal cardiovascular exam     Neuro/Psych CVA negative psych ROS   GI/Hepatic negative GI ROS, Neg liver ROS,   Endo/Other  diabetes  Renal/GU negative Renal ROS  negative genitourinary   Musculoskeletal negative musculoskeletal ROS (+)   Abdominal Normal abdominal exam  (+)   Peds negative pediatric ROS (+)  Hematology negative hematology ROS (+)   Anesthesia Other Findings   Reproductive/Obstetrics                             Anesthesia Physical Anesthesia Plan  ASA: III  Anesthesia Plan: MAC   Post-op Pain Management:    Induction: Intravenous  PONV Risk Score and Plan:   Airway Management Planned: Nasal Cannula  Additional Equipment:   Intra-op Plan:   Post-operative Plan:   Informed Consent: I have reviewed the patients History and Physical, chart, labs and discussed the procedure including the risks, benefits and alternatives for the proposed anesthesia with the patient or authorized representative who has indicated his/her understanding and acceptance.   Dental advisory given  Plan Discussed with: CRNA and Surgeon  Anesthesia Plan Comments:         Anesthesia Quick Evaluation

## 2017-06-24 ENCOUNTER — Encounter: Payer: Self-pay | Admitting: Cardiology

## 2017-06-24 LAB — TROPONIN I
TROPONIN I: 0.1 ng/mL — AB (ref <0.03)
Troponin I: 0.1 ng/mL (ref <0.03)

## 2017-06-24 LAB — GLUCOSE, CAPILLARY
Glucose-Capillary: 165 mg/dL — ABNORMAL HIGH (ref 65–99)
Glucose-Capillary: 243 mg/dL — ABNORMAL HIGH (ref 65–99)
Glucose-Capillary: 149 mg/dL — ABNORMAL HIGH (ref 65–99)
Glucose-Capillary: 154 mg/dL — ABNORMAL HIGH (ref 65–99)

## 2017-06-24 MED ORDER — CEPHALEXIN 250 MG PO CAPS: 250.0000 mg | ORAL_CAPSULE | Freq: Four times a day (QID) | ORAL | 0 refills | Status: AC

## 2017-06-24 MED ORDER — CEPHALEXIN 250 MG PO CAPS: 250.0000 mg | ORAL_CAPSULE | Freq: Four times a day (QID) | ORAL | 0 refills | Status: DC

## 2017-06-24 MED ORDER — TRAMADOL HCL 50 MG PO TABS
50.0000 mg | ORAL_TABLET | Freq: Four times a day (QID) | ORAL | Status: DC
  Administered 2017-06-24 – 2017-06-25 (×4): 50 mg via ORAL
  Filled 2017-06-24 (×4): qty 1

## 2017-06-24 NOTE — Progress Notes (Signed)
CRITICAL VALUE ALERT  Critical Value:  Troponin 0.10 Date & Time Notied:  06/24/17.1620  Provider Notified: Dr. Darrold JunkerParaschos

## 2017-06-24 NOTE — Care Management Important Message (Signed)
Important Message  Patient Details  Name: Haroldine LawsRaymond S Nieblas MRN: [id number] Date of Birth: 07/06/1945   Medicare Important Message Given:  N/A - LOS <3 / Initial given by admissions    Eber HongGreene, Braddock Servellon R, RN 06/24/2017, 1:26 PM

## 2017-06-24 NOTE — Care Management (Signed)
PPM insertion 2.6.  No issues with sensing and capturing  reported. No discharge needs identified by members of the care team

## 2017-06-24 NOTE — Progress Notes (Signed)
[facility]  SUBJECTIVE: The patient reports feeling fine this morning. He does report centralized chest pressure that is constant, 3/10 in intensity, but worse with deep inspiration. States it feels like indigestion. ECG reveals atrial sensed ventricular paced rhythm. Chest xray postoperatively reveals left-sided pacemaker leads grossly in good position without pneumothorax, with stable mild right midlung subsegmental atelectasis or inflammation.    Vitals:   06/23/17 1815 06/23/17 2031 06/24/17 0414 06/24/17 0713  BP: 94/79 134/69 (!) 157/89 138/80  Pulse: 79 91 83 86  Resp: 18 19  17   Temp: 98.2 F (36.8 C) 98.6 F (37 C) 98.5 F (36.9 C)   TempSrc: Oral Oral Oral   SpO2: 93% 99% 100% 95%  Weight:   95.6 kg (210 lb 12.8 oz)   Height:         Intake/Output Summary (Last 24 hours) at 06/24/2017 0840 Last data filed at 06/24/2017 0700 Gross per 24 hour  Intake 1750 ml  Output 1860 ml  Net -110 ml      PHYSICAL EXAM  General: Well developed, well nourished, in no acute distress HEENT:  Normocephalic and atramatic Neck:  No JVD.  Lungs: Clear bilaterally to auscultation Heart: HRRR . Normal S1 and S2 without gallops or murmurs.  Abdomen: Bowel sounds are positive, abdomen soft Chest wall: chest discomfort not reproducible to palpation Msk:  Back normal, able to sit upright. Gait not assessed. Extremities: No clubbing, cyanosis or edema.   Neuro: Alert and oriented X 3. Psych:  Good affect, responds appropriately Incision site: Left upper chest, steri strips intact without active drainage, bleeding, surrounding erythema, edema, or warmth. Scant dried blood on steri strips   LABS: Basic Metabolic Panel: Recent Labs    06/22/17 0953 06/23/17 0452  NA 140 142  K 3.7 3.8  CL 104 109  CO2 26 24  GLUCOSE 233* 136*  BUN 17 16  CREATININE 1.59* 1.30*  CALCIUM 9.7 9.1  MG 1.8  --    Liver Function Tests: Recent Labs    06/22/17 0953  AST 87*  ALT 93*  ALKPHOS 54   BILITOT 0.4  PROT 7.4  ALBUMIN 4.0   No results for input(s): LIPASE, AMYLASE in the last 72 hours. CBC: Recent Labs    06/22/17 0953 06/23/17 0452  WBC 5.6 7.5  NEUTROABS 4.1  --   HGB 13.3 12.8*  HCT 40.3 38.4*  MCV 85.6 85.0  PLT 219 199   Cardiac Enzymes: Recent Labs    06/22/17 0953 06/22/17 1558 06/22/17 2225  TROPONINI <0.03 0.06* 0.06*   BNP: Invalid input(s): POCBNP D-Dimer: No results for input(s): DDIMER in the last 72 hours. Hemoglobin A1C: Recent Labs    06/22/17 0953  HGBA1C 6.8*   Fasting Lipid Panel: No results for input(s): CHOL, HDL, LDLCALC, TRIG, CHOLHDL, LDLDIRECT in the last 72 hours. Thyroid Function Tests: Recent Labs    06/22/17 0953  TSH 1.773   Anemia Panel: No results for input(s): VITAMINB12, FOLATE, FERRITIN, TIBC, IRON, RETICCTPCT in the last 72 hours.  X-ray Chest Pa Or Ap  Result Date: 06/23/2017 CLINICAL DATA:  Status post pacemaker placement. EXAM: CHEST 1 VIEW COMPARISON:  Radiograph of June 23, 2017. FINDINGS: Stable cardiomediastinal silhouette. Left-sided pacemaker is noted with leads in grossly good position. No pneumothorax or pleural effusion is noted. Mild right upper lobe opacity is noted concerning for possible inflammation or atelectasis. Left lung is clear. Bony thorax is unremarkable. IMPRESSION: Left-sided pacemaker is noted with leads in grossly good  position. No pneumothorax is noted. Stable mild right midlung subsegmental atelectasis or inflammation. Electronically Signed   By: Lupita RaiderJames  Green Jr, M.D.   On: 06/23/2017 16:19   Dg Chest 1 View  Result Date: 06/22/2017 CLINICAL DATA:  Generalized weakness and shortness of breath since early this morning. Bradycardia. EXAM: CHEST 1 VIEW COMPARISON:  Chest CT 06/25/2004 FINDINGS: The heart is upper limits of normal in size given the AP projection and portable technique. There is tortuosity of the thoracic aorta. The pulmonary hila appear normal. No acute pulmonary  findings. External pacer paddles are noted. Mild eventration of the right hemidiaphragm. The bony thorax is intact. IMPRESSION: No acute cardiopulmonary findings. Electronically Signed   By: Rudie MeyerP.  Gallerani M.D.   On: 06/22/2017 10:35   Dg Chest Port 1 View  Result Date: 06/23/2017 CLINICAL DATA:  Status post pacemaker placement EXAM: PORTABLE CHEST 1 VIEW COMPARISON:  June 22, 2017 FINDINGS: There is a pacemaker on the left with leads attached to right atrium and right ventricle. No pneumothorax. There is mild atelectatic change in the right mid lung. Lungs elsewhere are clear. Heart is borderline enlarged with pulmonary vascularity within normal limits. No adenopathy. No bone lesions. IMPRESSION: Pacemaker on left with lead tips attached to right atrium and right ventricle. No pneumothorax. Stable cardiac prominence. Mild right midlung atelectasis. Lungs elsewhere clear. Electronically Signed   By: Bretta BangWilliam  Woodruff III M.D.   On: 06/23/2017 14:26   Dg C-arm 1-60 Min-no Report  Result Date: 06/23/2017 Fluoroscopy was utilized by the requesting physician.  No radiographic interpretation.   Dg C-arm 1-60 Min-no Report  Result Date: 06/23/2017 Fluoroscopy was utilized by the requesting physician.  No radiographic interpretation.     TELEMETRY: AS-VP rhythm, rate 79 bpm  ASSESSMENT AND PLAN:  Active Problems:   Bradycardia    1. High grade AV block, status post dual-chamber pacemaker implantation and lead revision. Appropriately sensing and pacing this morning. Incision site clean without surrounding erythema, warmth, edema, or significant tenderness. Arm sling and tegaderm removed.   Recommendations: 1. Counseled patient about post-operative precautions, including avoiding lifting greater than 15 pounds or raising left arm above head for the next 6 weeks.  2. Follow-up with Dr. Darrold JunkerParaschos in clinic in 1 week 3. Printed prescription for Keflex in patient chart   Leanora Ivanoffnna Zadaya Cuadra,  PA-C 06/24/2017 8:40 AM

## 2017-06-24 NOTE — Discharge Summary (Signed)
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 Diabetes mellitus without complication (HCC)   . Hypercholesterolemia   . Hypertension   . Stroke [facility](HCC)     HOSPITAL COURSE:  Eric KraftRaymond Andersen  is a 72 y.o. male with a known history of attention, diabetes mellitus, CKD stage III on metoprolol, clonidine and amlodipine presents to the hospital due to weakness/shortness of breath.  Here in the emergency room he has been found to have sinus bradycardia in the 30s with type II heart block   *Bradycardia with type II block and periods of complete heart block. - Stop clonidine and metoprolol--until seen by Cardiology as out pt -s/p PM placement  By dr Eric Andersen -prophylactic keflex for 7 days  *Hypertension.  Continue amlodipine.  Hold clonidine and metoprolol. BP reasonable  *Diabetes mellitus.  Sliding scale insulin.  *CKD stage III is stable  *DVT prophylaxis with heparin  Overall stable for d/c at home Spoke with Eric BastosAnna PA CONSULTS OBTAINED:    DRUG ALLERGIES:   Allergies  Allergen Reactions  . Diovan [Valsartan] Cough  . Ace Inhibitors Rash  . Hydrochlorothiazide Rash    DISCHARGE MEDICATIONS:   Allergies as of 06/24/2017      Reactions   Diovan [valsartan] Cough   Ace Inhibitors Rash   Hydrochlorothiazide Rash      Medication List    STOP taking these medications   cloNIDine 0.2 MG tablet Commonly known as:  CATAPRES   metoprolol tartrate 100 MG tablet Commonly known as:  LOPRESSOR     TAKE  these medications   amLODipine 10 MG tablet Commonly known as:  NORVASC Take 10 mg by mouth daily.   atorvastatin 80 MG tablet Commonly known as:  LIPITOR Take 80 mg by mouth daily.   carbamazepine 200 MG 12 hr tablet Commonly known as:  TEGRETOL XR Take 200 mg by mouth 2 (two) times daily.   cephALEXin 250 MG capsule Commonly known as:  KEFLEX Take 1 capsule (250 mg total) by mouth 4 (four) times daily for 7 days.   furosemide 20 MG tablet Commonly known as:  LASIX Take 20 mg by mouth daily.   glipiZIDE 2.5 MG 24 hr tablet Commonly known as:  GLUCOTROL XL Take 2.5 mg by mouth daily with breakfast.   linagliptin 5 MG Tabs tablet Commonly known as:  TRADJENTA Take 5 mg by mouth daily.   metFORMIN 1000 MG tablet Commonly known as:  GLUCOPHAGE Take 500 mg by mouth 2 (two) times daily with a meal.   niacin 500 MG tablet Take 500 mg by mouth at bedtime.   olmesartan 40 MG tablet Commonly known as:  BENICAR Take 40 mg by mouth daily.       If you experience worsening of your admission symptoms, develop shortness of breath, life threatening emergency, suicidal or homicidal thoughts you must seek medical attention immediately by calling 911 or calling your MD immediately  if symptoms less severe.  You Must read complete instructions/literature along with  all the possible adverse reactions/side effects for all the Medicines you take and that have been prescribed to you. Take any new Medicines after you have completely understood and accept all the possible adverse reactions/side effects.   Please note  You were cared for by a hospitalist during your hospital stay. If you have any questions about your discharge medications or the care you received while you were in the hospital after you are discharged, you can call the unit and asked to speak with the hospitalist on call if the hospitalist that took care of you is not available. Once you are discharged, your primary care  physician will handle any further medical issues. Please note that NO REFILLS for any discharge medications will be authorized once you are discharged, as it is imperative that you return to your primary care physician (or establish a relationship with a primary care physician if you do not have one) for your aftercare needs so that they can reassess your need for medications and monitor your lab values. Today   SUBJECTIVE   Doing well  VITAL SIGNS:  Blood pressure 138/80, pulse 86, temperature 98.5 F (36.9 C), temperature source Oral, resp. rate 17, height 6' (1.829 Andersen), weight 95.6 kg (210 lb 12.8 oz), SpO2 95 %.  I/O:    Intake/Output Summary (Last 24 hours) at 06/24/2017 1153 Last data filed at 06/24/2017 1013 Gross per 24 hour  Intake 1990 ml  Output 1860 ml  Net 130 ml    PHYSICAL EXAMINATION:  GENERAL:  72 y.o.-year-old patient lying in the bed with no acute distress.  EYES: Pupils equal, round, reactive to light and accommodation. No scleral icterus. Extraocular muscles intact.  HEENT: Head atraumatic, normocephalic. Oropharynx and nasopharynx clear.  NECK:  Supple, no jugular venous distention. No thyroid enlargement, no tenderness.  LUNGS: Normal breath sounds bilaterally, no wheezing, rales,rhonchi or crepitation. No use of accessory muscles of respiration. PM site looks ok CARDIOVASCULAR: S1, S2 normal. No murmurs, rubs, or gallops.  ABDOMEN: Soft, non-tender, non-distended. Bowel sounds present. No organomegaly or mass.  EXTREMITIES: No pedal edema, cyanosis, or clubbing.  NEUROLOGIC: Cranial nerves II through XII are intact. Muscle strength 5/5 in all extremities. Sensation intact. Gait not checked.  PSYCHIATRIC: The patient is alert and oriented x 3.  SKIN: No obvious rash, lesion, or ulcer.   DATA REVIEW:   CBC  Recent Labs  Lab 06/23/17 0452  WBC 7.5  HGB 12.8*  HCT 38.4*  PLT 199    Chemistries  Recent Labs  Lab 06/22/17 0953 06/23/17 0452  NA 140 142   K 3.7 3.8  CL 104 109  CO2 26 24  GLUCOSE 233* 136*  BUN 17 16  CREATININE 1.59* 1.30*  CALCIUM 9.7 9.1  MG 1.8  --   AST 87*  --   ALT 93*  --   ALKPHOS 54  --   BILITOT 0.4  --     Microbiology Results   Recent Results (from the past 240 hour(s))  MRSA PCR Screening     Status: None   Collection Time: 06/22/17  3:35 PM  Result Value Ref Range Status   MRSA by PCR NEGATIVE NEGATIVE Final    Comment:        The GeneXpert MRSA Assay (FDA approved for NASAL specimens only), is one component of a comprehensive MRSA colonization surveillance program. It is not intended to diagnose MRSA infection nor to guide or monitor treatment for MRSA infections. Performed at [facility]  [facility], [address], [place], [place] [address]     RADIOLOGY:  X-ray Chest Pa Or Ap  Result Date: 06/23/2017 CLINICAL DATA:  Status post pacemaker placement. EXAM: CHEST 1 VIEW COMPARISON:  Radiograph of June 23, 2017. FINDINGS: Stable cardiomediastinal silhouette. Left-sided pacemaker is noted with leads in grossly good position. No pneumothorax or pleural effusion is noted. Mild right upper lobe opacity is noted concerning for possible inflammation or atelectasis. Left lung is clear. Bony thorax is unremarkable. IMPRESSION: Left-sided pacemaker is noted with leads in grossly good position. No pneumothorax is noted. Stable mild right midlung subsegmental atelectasis or inflammation. Electronically Signed   By: Lupita Raider, Andersen.D.   On: 06/23/2017 16:19   Dg Chest Port 1 View  Result Date: 06/23/2017 CLINICAL DATA:  Status post pacemaker placement EXAM: PORTABLE CHEST 1 VIEW COMPARISON:  June 22, 2017 FINDINGS: There is a pacemaker on the left with leads attached to right atrium and right ventricle. No pneumothorax. There is mild atelectatic change in the right mid lung. Lungs elsewhere are clear. Heart is borderline enlarged with pulmonary vascularity within normal limits. No adenopathy. No  bone lesions. IMPRESSION: Pacemaker on left with lead tips attached to right atrium and right ventricle. No pneumothorax. Stable cardiac prominence. Mild right midlung atelectasis. Lungs elsewhere clear. Electronically Signed   By: Bretta Bang III Andersen.D.   On: 06/23/2017 14:26   Dg C-arm 1-60 Min-no Report  Result Date: 06/23/2017 Fluoroscopy was utilized by the requesting physician.  No radiographic interpretation.   Dg C-arm 1-60 Min-no Report  Result Date: 06/23/2017 Fluoroscopy was utilized by the requesting physician.  No radiographic interpretation.     Management plans discussed with the patient, family and they are in agreement.  CODE STATUS:     Code Status Orders  (From admission, onward)        Start     Ordered   06/22/17 1207  Full code  Continuous     06/22/17 1208    Code Status History    Date Active Date Inactive Code Status Order ID Comments User Context   This patient has a current code status but no historical code status.      TOTAL TIME TAKING CARE OF THIS PATIENT: 40  minutes.    Enedina Finner Andersen.D on 06/24/2017 at 11:53 AM  Between 7am to 6pm - Pager - [phone] After 6pm go to [email or web address] - Social research officer, government  [facility]  Office  [phone]  CC: Primary care physician; Eric Sato, MD

## 2017-06-24 NOTE — Progress Notes (Addendum)
Attempted to ambulate patient around  nurses station, was only able to get to the door and patient started complaining of chest pain 9 of 10. Pt describes pain as pressure. Also per CCMD patient has wide QRS HR 100's. Dr. Darrold JunkerParaschos notified. Per Dr. Darrold JunkerParaschos hold discharge, do STAT EKG, and get set of troponins. Will continue to monitor.

## 2017-06-24 NOTE — Plan of Care (Signed)
Patient has been complaining of intermittent chest pressure since procedure was completed. Unable to describe any type of onset. Denies any type of indigestion.

## 2017-06-25 LAB — GLUCOSE, CAPILLARY
Glucose-Capillary: 164 mg/dL — ABNORMAL HIGH (ref 65–99)
Glucose-Capillary: 199 mg/dL — ABNORMAL HIGH (ref 65–99)

## 2017-06-25 LAB — TROPONIN I: TROPONIN I: 0.09 ng/mL — AB (ref <0.03)

## 2017-06-25 MED ORDER — TRAMADOL HCL 50 MG PO TABS: 50.0000 mg | ORAL_TABLET | Freq: Four times a day (QID) | ORAL | 0 refills | Status: DC

## 2017-06-25 NOTE — Care Management Important Message (Signed)
Important Message  Patient Details  Name: Eric Andersen MRN: [id number] Date of Birth: [birth year]   Medicare Important Message Given:  Yes Signed IM notice given    Eber HongGreene, Winna Golla R, RN 06/25/2017, 4:49 PM

## 2017-06-25 NOTE — Progress Notes (Signed)
Pt ambulated around nurses station and tolerated well, no complains of cp, no distress noted.

## 2017-06-25 NOTE — Discharge Summary (Signed)
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 Diabetes mellitus without complication (HCC)   . Hypercholesterolemia   . Hypertension   . Stroke [facility])     HOSPITAL COURSE:  Eric Andersen  is a 72 y.o. male with a known history of attention, diabetes mellitus, CKD stage III on metoprolol, clonidine and amlodipine presents to the hospital due to weakness/shortness of breath.  Here in the emergency room he has been found to have sinus bradycardia in the 30s with type II heart block   *Bradycardia with type II block and periods of complete heart block. - Stop clonidine and metoprolol--until seen by Cardiology as out pt -s/p PM placement  By dr Cassie Freer -prophylactic keflex for 7 days -pt had some pleuritic cp y'day and d/c held. Doing well today. Ambulated w/o CP.  *Hypertension.  Continue amlodipine.  Hold clonidine and metoprolol. BP reasonable  *Diabetes mellitus.  Sliding scale insulin.  *CKD stage III is stable  *DVT prophylaxis with heparin  Overall stable for d/c at home Spoke with DR Parachos CONSULTS OBTAINED:    DRUG ALLERGIES:   Allergies  Allergen Reactions  . Diovan [Valsartan] Cough  . Ace Inhibitors Rash  . Hydrochlorothiazide Rash    DISCHARGE MEDICATIONS:   Allergies as of 06/25/2017      Reactions   Diovan [valsartan] Cough   Ace Inhibitors Rash   Hydrochlorothiazide Rash      Medication List    STOP taking these medications   cloNIDine 0.2 MG tablet Commonly known as:   CATAPRES   metoprolol tartrate 100 MG tablet Commonly known as:  LOPRESSOR     TAKE these medications   amLODipine 10 MG tablet Commonly known as:  NORVASC Take 10 mg by mouth daily.   atorvastatin 80 MG tablet Commonly known as:  LIPITOR Take 80 mg by mouth daily.   carbamazepine 200 MG 12 hr tablet Commonly known as:  TEGRETOL XR Take 200 mg by mouth 2 (two) times daily.   cephALEXin 250 MG capsule Commonly known as:  KEFLEX Take 1 capsule (250 mg total) by mouth 4 (four) times daily for 7 days.   furosemide 20 MG tablet Commonly known as:  LASIX Take 20 mg by mouth daily.   glipiZIDE 2.5 MG 24 hr tablet Commonly known as:  GLUCOTROL XL Take 2.5 mg by mouth daily with breakfast.   linagliptin 5 MG Tabs tablet Commonly known as:  TRADJENTA Take 5 mg by mouth daily.   metFORMIN 1000 MG tablet Commonly known as:  GLUCOPHAGE Take 500 mg by mouth 2 (two) times daily with a meal.   niacin 500 MG tablet Take 500 mg by mouth at bedtime.   olmesartan 40 MG tablet Commonly known as:  BENICAR Take 40 mg by mouth daily.   traMADol 50 MG tablet Commonly known as:  ULTRAM Take 1 tablet (50 mg total) by mouth every 6 (six) hours. Take as needed after 24 hours       If you  experience worsening of your admission symptoms, develop shortness of breath, life threatening emergency, suicidal or homicidal thoughts you must seek medical attention immediately by calling 911 or calling your MD immediately  if symptoms less severe.  You Must read complete instructions/literature along with all the possible adverse reactions/side effects for all the Medicines you take and that have been prescribed to you. Take any new Medicines after you have completely understood and accept all the possible adverse reactions/side effects.   Please note  You were cared for by a hospitalist during your hospital stay. If you have any questions about your discharge medications or the care you received  while you were in the hospital after you are discharged, you can call the unit and asked to speak with the hospitalist on call if the hospitalist that took care of you is not available. Once you are discharged, your primary care physician will handle any further medical issues. Please note that NO REFILLS for any discharge medications will be authorized once you are discharged, as it is imperative that you return to your primary care physician (or establish a relationship with a primary care physician if you do not have one) for your aftercare needs so that they can reassess your need for medications and monitor your lab values. Today   SUBJECTIVE   Doing well  VITAL SIGNS:  Blood pressure (!) 159/87, pulse 82, temperature 98.4 F (36.9 C), temperature source Oral, resp. rate 18, height 6' (1.829 m), weight 94.3 kg (208 lb), SpO2 96 %.  I/O:    Intake/Output Summary (Last 24 hours) at 06/25/2017 1144 Last data filed at 06/25/2017 1010 Gross per 24 hour  Intake 1200 ml  Output 1100 ml  Net 100 ml    PHYSICAL EXAMINATION:  GENERAL:  72 y.o.-year-old patient lying in the bed with no acute distress.  EYES: Pupils equal, round, reactive to light and accommodation. No scleral icterus. Extraocular muscles intact.  HEENT: Head atraumatic, normocephalic. Oropharynx and nasopharynx clear.  NECK:  Supple, no jugular venous distention. No thyroid enlargement, no tenderness.  LUNGS: Normal breath sounds bilaterally, no wheezing, rales,rhonchi or crepitation. No use of accessory muscles of respiration. PM site looks ok CARDIOVASCULAR: S1, S2 normal. No murmurs, rubs, or gallops.  ABDOMEN: Soft, non-tender, non-distended. Bowel sounds present. No organomegaly or mass.  EXTREMITIES: No pedal edema, cyanosis, or clubbing.  NEUROLOGIC: Cranial nerves II through XII are intact. Muscle strength 5/5 in all extremities. Sensation intact. Gait not checked.  PSYCHIATRIC: The patient is alert and oriented x 3.   SKIN: No obvious rash, lesion, or ulcer.   DATA REVIEW:   CBC  Recent Labs  Lab 06/23/17 0452  WBC 7.5  HGB 12.8*  HCT 38.4*  PLT 199    Chemistries  Recent Labs  Lab 06/22/17 0953 06/23/17 0452  NA 140 142  K 3.7 3.8  CL 104 109  CO2 26 24  GLUCOSE 233* 136*  BUN 17 16  CREATININE 1.59* 1.30*  CALCIUM 9.7 9.1  MG 1.8  --   AST 87*  --   ALT 93*  --   ALKPHOS 54  --   BILITOT 0.4  --     Microbiology Results   Recent Results (from the past 240 hour(s))  MRSA PCR Screening     Status: None   Collection Time: 06/22/17  3:35 PM  Result Value Ref Range Status   MRSA by PCR NEGATIVE NEGATIVE Final    Comment:  The GeneXpert MRSA Assay (FDA approved for NASAL specimens only), is one component of a comprehensive MRSA colonization surveillance program. It is not intended to diagnose MRSA infection nor to guide or monitor treatment for MRSA infections. Performed at [facility], [address], [place], [place] [address]     RADIOLOGY:  X-ray Chest Pa Or Ap  Result Date: 06/23/2017 CLINICAL DATA:  Status post pacemaker placement. EXAM: CHEST 1 VIEW COMPARISON:  Radiograph of June 23, 2017. FINDINGS: Stable cardiomediastinal silhouette. Left-sided pacemaker is noted with leads in grossly good position. No pneumothorax or pleural effusion is noted. Mild right upper lobe opacity is noted concerning for possible inflammation or atelectasis. Left lung is clear. Bony thorax is unremarkable. IMPRESSION: Left-sided pacemaker is noted with leads in grossly good position. No pneumothorax is noted. Stable mild right midlung subsegmental atelectasis or inflammation. Electronically Signed   By: Lupita Raider, M.D.   On: 06/23/2017 16:19   Dg Chest Port 1 View  Result Date: 06/23/2017 CLINICAL DATA:  Status post pacemaker placement EXAM: PORTABLE CHEST 1 VIEW COMPARISON:  June 22, 2017 FINDINGS: There is a pacemaker on the left with leads attached to  right atrium and right ventricle. No pneumothorax. There is mild atelectatic change in the right mid lung. Lungs elsewhere are clear. Heart is borderline enlarged with pulmonary vascularity within normal limits. No adenopathy. No bone lesions. IMPRESSION: Pacemaker on left with lead tips attached to right atrium and right ventricle. No pneumothorax. Stable cardiac prominence. Mild right midlung atelectasis. Lungs elsewhere clear. Electronically Signed   By: Bretta Bang III M.D.   On: 06/23/2017 14:26   Dg C-arm 1-60 Min-no Report  Result Date: 06/23/2017 Fluoroscopy was utilized by the requesting physician.  No radiographic interpretation.   Dg C-arm 1-60 Min-no Report  Result Date: 06/23/2017 Fluoroscopy was utilized by the requesting physician.  No radiographic interpretation.     Management plans discussed with the patient, family and they are in agreement.  CODE STATUS:     Code Status Orders  (From admission, onward)        Start     Ordered   06/22/17 1207  Full code  Continuous     06/22/17 1208    Code Status History    Date Active Date Inactive Code Status Order ID Comments User Context   This patient has a current code status but no historical code status.      TOTAL TIME TAKING CARE OF THIS PATIENT: 40  minutes.    Enedina Finner M.D on 06/25/2017 at 11:44 AM  Between 7am to 6pm - Pager - [phone] After 6pm go to [email or web address] - Social research officer, government  [facility]  Office  [phone]  CC: Primary care physician; Leanna Sato, MD

## 2017-06-25 NOTE — Progress Notes (Addendum)
Haroldine Lawsaymond S Victorian to be D/C'd Home per MD order.  Discussed prescriptions and follow up appointments with the patient and wife. Prescriptions given to patient, medication list explained in detail. Pt verbalized understanding.  Allergies as of 06/25/2017      Reactions   Diovan [valsartan] Cough   Ace Inhibitors Rash   Hydrochlorothiazide Rash      Medication List    STOP taking these medications   cloNIDine 0.2 MG tablet Commonly known as:  CATAPRES   metoprolol tartrate 100 MG tablet Commonly known as:  LOPRESSOR     TAKE these medications   amLODipine 10 MG tablet Commonly known as:  NORVASC Take 10 mg by mouth daily.   atorvastatin 80 MG tablet Commonly known as:  LIPITOR Take 80 mg by mouth daily.   carbamazepine 200 MG 12 hr tablet Commonly known as:  TEGRETOL XR Take 200 mg by mouth 2 (two) times daily.   cephALEXin 250 MG capsule Commonly known as:  KEFLEX Take 1 capsule (250 mg total) by mouth 4 (four) times daily for 7 days.   furosemide 20 MG tablet Commonly known as:  LASIX Take 20 mg by mouth daily.   glipiZIDE 2.5 MG 24 hr tablet Commonly known as:  GLUCOTROL XL Take 2.5 mg by mouth daily with breakfast.   linagliptin 5 MG Tabs tablet Commonly known as:  TRADJENTA Take 5 mg by mouth daily.   metFORMIN 1000 MG tablet Commonly known as:  GLUCOPHAGE Take 500 mg by mouth 2 (two) times daily with a meal.   niacin 500 MG tablet Take 500 mg by mouth at bedtime.   olmesartan 40 MG tablet Commonly known as:  BENICAR Take 40 mg by mouth daily.   traMADol 50 MG tablet Commonly known as:  ULTRAM Take 1 tablet (50 mg total) by mouth every 6 (six) hours. Take as needed after 24 hours       Vitals:   06/25/17 0807 06/25/17 1150  BP: (!) 159/87 (!) 143/98  Pulse: 82 83  Resp: 18 18  Temp: 98.4 F (36.9 C)   SpO2: 96%     Tele box removed and returned.Skin clean, dry and intact without evidence of skin break down, no evidence of skin tears noted. IV  catheter discontinued intact. Site without signs and symptoms of complications. Dressing and pressure applied. Pt denies pain at this time. No complaints noted.  An After Visit Summary was printed and given to the patient. Patient escorted via WC, and D/C home via private auto.  Rigoberto NoelErica Y Argentina Kosch

## 2019-01-06 ENCOUNTER — Other Ambulatory Visit
Admission: RE | Admit: 2019-01-06 | Discharge: 2019-01-06 | Disposition: A | Discharge status: Home / Self Care | Payer: Medicare HMO | Source: Ambulatory Visit | Attending: Internal Medicine | Admitting: Internal Medicine

## 2019-01-06 ENCOUNTER — Other Ambulatory Visit: Payer: Self-pay

## 2019-01-06 DIAGNOSIS — Z20828 Contact with and (suspected) exposure to other viral communicable diseases: Secondary | ICD-10-CM | POA: Diagnosis not present

## 2019-01-06 DIAGNOSIS — Z01812 Encounter for preprocedural laboratory examination: Secondary | ICD-10-CM | POA: Insufficient documentation

## 2019-01-06 LAB — SARS CORONAVIRUS 2 (TAT 6-24 HRS): SARS Coronavirus 2: NEGATIVE

## 2019-01-09 ENCOUNTER — Other Ambulatory Visit: Admission: RE | Admit: 2019-01-09 | Payer: Medicare HMO | Source: Ambulatory Visit

## 2019-01-10 ENCOUNTER — Encounter: Payer: Self-pay | Admitting: *Deleted

## 2019-01-10 NOTE — H&P (Signed)
Outpatient short stay form Pre-procedure 01/10/2019 2:02 PM  Eric Andersen, M.D.  Primary Physician: [name], M.D.  Reason for visit:  Personal hx of tubular adenomatous polyp.   History of present illness: 73 year old male presents for colon polyp surveillance.  Last colonoscopy in 2015 revealed diverticulosis of the colon without polyps.  Last colon with tubular adenoma polyps was in 2009.Patient denies change in bowel habits, rectal bleeding, weight loss or abdominal pain.    No current facility-administered medications for this encounter.   Current Outpatient Medications:  .  metoprolol succinate (TOPROL-XL) 50 MG 24 hr tablet, Take 50 mg by mouth daily. Take with or immediately following a meal., Disp: , Rfl:  .  amLODipine (NORVASC) 10 MG tablet, Take 10 mg by mouth daily., Disp: , Rfl:  .  atorvastatin (LIPITOR) 80 MG tablet, Take 80 mg by mouth daily., Disp: , Rfl:  .  carbamazepine (TEGRETOL XR) 200 MG 12 hr tablet, Take 200 mg by mouth 2 (two) times daily., Disp: , Rfl:  .  furosemide (LASIX) 20 MG tablet, Take 20 mg by mouth daily. , Disp: , Rfl:  .  glipiZIDE (GLUCOTROL XL) 2.5 MG 24 hr tablet, Take 2.5 mg by mouth daily with breakfast., Disp: , Rfl:  .  linagliptin (TRADJENTA) 5 MG TABS tablet, Take 5 mg by mouth daily., Disp: , Rfl:  .  metFORMIN (GLUCOPHAGE) 1000 MG tablet, Take 500 mg by mouth 2 (two) times daily with a meal., Disp: , Rfl:  .  niacin 500 MG tablet, Take 500 mg by mouth at bedtime., Disp: , Rfl:  .  olmesartan (BENICAR) 40 MG tablet, Take 40 mg by mouth daily., Disp: , Rfl:  .  traMADol (ULTRAM) 50 MG tablet, Take 1 tablet (50 mg total) by mouth every 6 (six) hours. Take as needed after 24 hours, Disp: 20 tablet, Rfl: 0  No medications prior to admission.     Allergies  Allergen Reactions  . Diovan [Valsartan] Cough  . Micardis [Telmisartan] Cough  . Ace Inhibitors Rash  . Hydrochlorothiazide Rash     Past Medical History:  Diagnosis Date   . Arthritis   . Diabetes mellitus without complication ([place])   . History of kidney stones   . Hypercholesterolemia   . Hypertension   . Sleep apnea   . Stroke [facility])     Review of systems:  Otherwise negative.    Physical Exam  Gen: Alert, oriented. Appears stated age.  HEENT: [place]/AT. PERRLA. Lungs: CTA, no wheezes. CV: RR nl S1, S2. Abd: soft, benign, no masses. BS+ Ext: No edema. Pulses 2+    Planned procedures: Proceed with colonoscopy. The patient understands the nature of the planned procedure, indications, risks, alternatives and potential complications including but not limited to bleeding, infection, perforation, damage to internal organs and possible oversedation/side effects from anesthesia. The patient agrees and gives consent to proceed.  Please refer to procedure notes for findings, recommendations and patient disposition/instructions.      Eric Andersen, M.D. Gastroenterology 01/10/2019  2:02 PM

## 2019-01-11 ENCOUNTER — Encounter: Payer: Self-pay | Admitting: *Deleted

## 2019-01-11 ENCOUNTER — Encounter
Admission: RE | Disposition: A | Discharge status: Home / Self Care | Payer: Self-pay | Source: Home / Self Care | Attending: Internal Medicine

## 2019-01-11 ENCOUNTER — Ambulatory Visit: Payer: Medicare HMO | Admitting: Anesthesiology

## 2019-01-11 ENCOUNTER — Other Ambulatory Visit: Payer: Self-pay

## 2019-01-11 ENCOUNTER — Ambulatory Visit
Admission: RE | Admit: 2019-01-11 | Discharge: 2019-01-11 | Disposition: A | Discharge status: Home / Self Care | Payer: Medicare HMO | Attending: Internal Medicine | Admitting: Internal Medicine

## 2019-01-11 DIAGNOSIS — M1990 Unspecified osteoarthritis, unspecified site: Secondary | ICD-10-CM | POA: Diagnosis not present

## 2019-01-11 DIAGNOSIS — Z86010 Personal history of colonic polyps: Secondary | ICD-10-CM | POA: Insufficient documentation

## 2019-01-11 DIAGNOSIS — I10 Essential (primary) hypertension: Secondary | ICD-10-CM | POA: Diagnosis not present

## 2019-01-11 DIAGNOSIS — Z8673 Personal history of transient ischemic attack (TIA), and cerebral infarction without residual deficits: Secondary | ICD-10-CM | POA: Insufficient documentation

## 2019-01-11 DIAGNOSIS — Z1211 Encounter for screening for malignant neoplasm of colon: Secondary | ICD-10-CM | POA: Insufficient documentation

## 2019-01-11 DIAGNOSIS — Z79899 Other long term (current) drug therapy: Secondary | ICD-10-CM | POA: Insufficient documentation

## 2019-01-11 DIAGNOSIS — K5730 Diverticulosis of large intestine without perforation or abscess without bleeding: Secondary | ICD-10-CM | POA: Insufficient documentation

## 2019-01-11 DIAGNOSIS — Z87442 Personal history of urinary calculi: Secondary | ICD-10-CM | POA: Insufficient documentation

## 2019-01-11 DIAGNOSIS — Z888 Allergy status to other drugs, medicaments and biological substances status: Secondary | ICD-10-CM | POA: Insufficient documentation

## 2019-01-11 DIAGNOSIS — Z7984 Long term (current) use of oral hypoglycemic drugs: Secondary | ICD-10-CM | POA: Insufficient documentation

## 2019-01-11 DIAGNOSIS — G4730 Sleep apnea, unspecified: Secondary | ICD-10-CM | POA: Diagnosis not present

## 2019-01-11 DIAGNOSIS — E7800 Pure hypercholesterolemia, unspecified: Secondary | ICD-10-CM | POA: Diagnosis not present

## 2019-01-11 DIAGNOSIS — E119 Type 2 diabetes mellitus without complications: Secondary | ICD-10-CM | POA: Diagnosis not present

## 2019-01-11 DIAGNOSIS — K640 First degree hemorrhoids: Secondary | ICD-10-CM | POA: Diagnosis not present

## 2019-01-11 HISTORY — DX: Personal history of urinary calculi: Z87.442

## 2019-01-11 HISTORY — DX: Unspecified osteoarthritis, unspecified site: M19.90

## 2019-01-11 HISTORY — DX: Sleep apnea, unspecified: G47.30

## 2019-01-11 HISTORY — PX: COLONOSCOPY WITH PROPOFOL: SHX5780

## 2019-01-11 LAB — GLUCOSE, CAPILLARY: Glucose-Capillary: 113 mg/dL — ABNORMAL HIGH (ref 70–99)

## 2019-01-11 SURGERY — COLONOSCOPY WITH PROPOFOL
Anesthesia: General

## 2019-01-11 MED ORDER — LIDOCAINE HCL (CARDIAC) PF 100 MG/5ML IV SOSY
PREFILLED_SYRINGE | INTRAVENOUS | Status: DC | PRN
  Administered 2019-01-11: 20 mg via INTRAVENOUS

## 2019-01-11 MED ORDER — PROPOFOL 500 MG/50ML IV EMUL
INTRAVENOUS | Status: DC | PRN
  Administered 2019-01-11: 180 ug/kg/min via INTRAVENOUS

## 2019-01-11 MED ORDER — PROPOFOL 10 MG/ML IV BOLUS
INTRAVENOUS | Status: AC
Start: 2019-01-11 — End: ?
  Filled 2019-01-11: qty 20

## 2019-01-11 MED ORDER — SODIUM CHLORIDE 0.9 % IV SOLN
INTRAVENOUS | Status: DC
  Administered 2019-01-11: 09:00:00 via INTRAVENOUS

## 2019-01-11 MED ORDER — OXYMETAZOLINE HCL 0.05 % NA SOLN
NASAL | Status: AC
  Filled 2019-01-11: qty 30

## 2019-01-11 MED ORDER — PROPOFOL 10 MG/ML IV BOLUS
INTRAVENOUS | Status: DC | PRN
  Administered 2019-01-11: 40 mg via INTRAVENOUS

## 2019-01-11 NOTE — Anesthesia Preprocedure Evaluation (Signed)
Anesthesia Evaluation  Patient identified by MRN, date of birth, ID band Patient awake    Reviewed: Allergy & Precautions, NPO status , Patient's Chart, lab work & pertinent test results  History of Anesthesia Complications Negative for: history of anesthetic complications  Airway Mallampati: II  TM Distance: >3 FB Neck ROM: Full    Dental  (+) Edentulous Upper, Edentulous Lower   Pulmonary sleep apnea , neg COPD, former smoker,    breath sounds clear to auscultation- rhonchi (-) wheezing      Cardiovascular hypertension, (-) CAD, (-) Past MI, (-) Cardiac Stents and (-) CABG  Rhythm:Regular Rate:Normal - Systolic murmurs and - Diastolic murmurs    Neuro/Psych neg Seizures CVA, No Residual Symptoms negative psych ROS   GI/Hepatic negative GI ROS, Neg liver ROS,   Endo/Other  diabetes, Oral Hypoglycemic Agents  Renal/GU negative Renal ROS     Musculoskeletal  (+) Arthritis ,   Abdominal (+) - obese,   Peds  Hematology negative hematology ROS (+)   Anesthesia Other Findings Past Medical History: No date: Arthritis No date: Diabetes mellitus without complication (HCC) No date: History of kidney stones No date: Hypercholesterolemia No date: Hypertension No date: Sleep apnea No date: Stroke [facility])   Reproductive/Obstetrics                             Anesthesia Physical Anesthesia Plan  ASA: III  Anesthesia Plan: General   Post-op Pain Management:    Induction: Intravenous  PONV Risk Score and Plan: 1 and Propofol infusion  Airway Management Planned: Natural Airway  Additional Equipment:   Intra-op Plan:   Post-operative Plan:   Informed Consent: I have reviewed the patients History and Physical, chart, labs and discussed the procedure including the risks, benefits and alternatives for the proposed anesthesia with the patient or authorized representative who has indicated  his/her understanding and acceptance.     Dental advisory given  Plan Discussed with: CRNA and Anesthesiologist  Anesthesia Plan Comments:         Anesthesia Quick Evaluation

## 2019-01-11 NOTE — Interval H&P Note (Signed)
History and Physical Interval Note:  01/11/2019 8:16 AM  Eric Andersen  has presented today for surgery, with the diagnosis of [place].  The various methods of treatment have been discussed with the patient and family. After consideration of risks, benefits and other options for treatment, the patient has consented to  Procedure(s): COLONOSCOPY WITH PROPOFOL (N/A) as a surgical intervention.  The patient's history has been reviewed, patient examined, no change in status, stable for surgery.  I have reviewed the patient's chart and labs.  Questions were answered to the patient's satisfaction.     [place], [place]

## 2019-01-11 NOTE — Transfer of Care (Signed)
Immediate Anesthesia Transfer of Care Note  Patient: Eric Andersen  Procedure(s) Performed: COLONOSCOPY WITH PROPOFOL (N/A )  Patient Location: PACU  Anesthesia Type:General  Level of Consciousness: sedated  Airway & Oxygen Therapy: Patient Spontanous Breathing and Patient connected to nasal cannula oxygen  Post-op Assessment: Report given to RN and Post -op Vital signs reviewed and stable  Post vital signs: Reviewed and stable  Last Vitals:  Vitals Value Taken Time  BP 103/68 01/11/19 0939  Temp    Pulse 61 01/11/19 0941  Resp 17 01/11/19 0941  SpO2 99 % 01/11/19 0941  Vitals shown include unvalidated device data.  Last Pain:  Vitals:   01/11/19 0930  TempSrc: Oral  PainSc:          Complications: No apparent anesthesia complications

## 2019-01-11 NOTE — Op Note (Signed)
[facility] Patient Name: Eric Andersen Procedure Date: 01/11/2019 9:01 AM MRN: [id number] Account #: [id number] Date of Birth: 1946-03-12 Admit Type: Outpatient Age: 73 Room: [facility] ENDO ROOM 3 Gender: Male Note Status: Finalized Procedure:            Colonoscopy Indications:          High risk colon cancer surveillance: Personal history                        of colonic polyps Providers:            Benay Pike. Alice Reichert MD, MD Referring MD:         Marguerita Merles, MD (Referring MD) Medicines:            Propofol per Anesthesia Complications:        No immediate complications. Procedure:            Pre-Anesthesia Assessment:                       - The risks and benefits of the procedure and the                        sedation options and risks were discussed with the                        patient. All questions were answered and informed                        consent was obtained.                       - Patient identification and proposed procedure were                        verified prior to the procedure by the nurse. The                        procedure was verified in the procedure room.                       - ASA Grade Assessment: III - A patient with severe                        systemic disease.                       - After reviewing the risks and benefits, the patient                        was deemed in satisfactory condition to undergo the                        procedure.                       After obtaining informed consent, the colonoscope was                        passed under direct vision. Throughout the procedure,  the patient's blood pressure, pulse, and oxygen                        saturations were monitored continuously. The                        Colonoscope was introduced through the anus and                        advanced to the the cecum, identified by appendiceal                        orifice  and ileocecal valve. The colonoscopy was                        performed without difficulty. The patient tolerated the                        procedure well. The quality of the bowel preparation                        was adequate. The ileocecal valve, appendiceal orifice,                        and rectum were photographed. Findings:      The perianal and digital rectal examinations were normal. Pertinent       negatives include normal sphincter tone and no palpable rectal lesions.      Many small and large-mouthed diverticula were found in the entire colon.      Internal hemorrhoids were found during retroflexion. The hemorrhoids       were Grade I (internal hemorrhoids that do not prolapse).      The exam was otherwise without abnormality. Impression:           - Diverticulosis in the entire examined colon.                       - Internal hemorrhoids.                       - The examination was otherwise normal.                       - No specimens collected. Recommendation:       - Patient has a contact number available for                        emergencies. The signs and symptoms of potential                        delayed complications were discussed with the patient.                        Return to normal activities tomorrow. Written discharge                        instructions were provided to the patient.                       - Resume previous diet.                       -  Continue present medications.                       - Repeat colonoscopy in 5 years for surveillance.                       - Return to GI office PRN. Procedure Code(s):    --- Professional ---                       [id number], Colorectal cancer screening; colonoscopy on                        individual at high risk Diagnosis Code(s):    --- Professional ---                       K57.30, Diverticulosis of large intestine without                        perforation or abscess without bleeding                        K64.0, First degree hemorrhoids                       Z86.010, Personal history of colonic polyps CPT copyright 2019 American Medical Association. All rights reserved. The codes documented in this report are preliminary and upon coder review may  be revised to meet current compliance requirements. Stanton Kidneyeodoro K  MD, MD 01/11/2019 9:36:23 AM This report has been signed electronically. Number of Addenda: 0 Note Initiated On: 01/11/2019 9:01 AM Scope Withdrawal Time: 0 hours 7 minutes 36 seconds  Total Procedure Duration: 0 hours 12 minutes 51 seconds  Estimated Blood Loss: Estimated blood loss: none.      [facility]

## 2019-01-11 NOTE — Anesthesia Postprocedure Evaluation (Signed)
Anesthesia Post Note  Patient: Eric Andersen  Procedure(s) Performed: COLONOSCOPY WITH PROPOFOL (N/A )  Patient location during evaluation: PACU Anesthesia Type: General Level of consciousness: awake and alert and oriented Pain management: pain level controlled Vital Signs Assessment: post-procedure vital signs reviewed and stable Respiratory status: spontaneous breathing, nonlabored ventilation and respiratory function stable Cardiovascular status: blood pressure returned to baseline and stable Postop Assessment: no signs of nausea or vomiting Anesthetic complications: no     Last Vitals:  Vitals:   01/11/19 0950 01/11/19 1000  BP: 118/88 (!) 141/92  Pulse: 62 (!) 59  Resp: 13 18  Temp:    SpO2: 98% 100%    Last Pain:  Vitals:   01/11/19 0930  TempSrc: Oral  PainSc:                  [name]

## 2019-01-11 NOTE — Anesthesia Post-op Follow-up Note (Signed)
Anesthesia QCDR form completed.        

## 2019-01-11 NOTE — Anesthesia Procedure Notes (Signed)
Date/Time: 01/11/2019 9:00 AM Performed by: Allean Found, CRNA Pre-anesthesia Checklist: Patient identified, Emergency Drugs available, Suction available, Patient being monitored and Timeout performed Patient Re-evaluated:Patient Re-evaluated prior to induction Oxygen Delivery Method: Nasal cannula Placement Confirmation: positive ETCO2

## 2019-01-12 ENCOUNTER — Encounter: Payer: Self-pay | Admitting: Internal Medicine

## 2020-11-22 ENCOUNTER — Observation Stay
Admission: EM | Admit: 2020-11-22 | Discharge: 2020-11-22 | Disposition: A | Discharge status: Home / Self Care | Payer: Medicare HMO | Source: Home / Self Care | Attending: Internal Medicine | Admitting: Internal Medicine

## 2020-11-22 ENCOUNTER — Emergency Department: Payer: Medicare HMO

## 2020-11-22 ENCOUNTER — Observation Stay: Payer: Medicare HMO

## 2020-11-22 ENCOUNTER — Observation Stay
Admission: EM | Admit: 2020-11-22 | Discharge: 2020-11-23 | Disposition: A | Discharge status: Home / Self Care | Payer: Medicare HMO | Attending: Student | Admitting: Student

## 2020-11-22 ENCOUNTER — Other Ambulatory Visit: Payer: Self-pay

## 2020-11-22 ENCOUNTER — Encounter: Payer: Self-pay | Admitting: Emergency Medicine

## 2020-11-22 DIAGNOSIS — Z7984 Long term (current) use of oral hypoglycemic drugs: Secondary | ICD-10-CM | POA: Insufficient documentation

## 2020-11-22 DIAGNOSIS — Z20822 Contact with and (suspected) exposure to covid-19: Secondary | ICD-10-CM | POA: Insufficient documentation

## 2020-11-22 DIAGNOSIS — E1122 Type 2 diabetes mellitus with diabetic chronic kidney disease: Secondary | ICD-10-CM | POA: Insufficient documentation

## 2020-11-22 DIAGNOSIS — Z87891 Personal history of nicotine dependence: Secondary | ICD-10-CM | POA: Diagnosis not present

## 2020-11-22 DIAGNOSIS — G459 Transient cerebral ischemic attack, unspecified: Secondary | ICD-10-CM | POA: Diagnosis not present

## 2020-11-22 DIAGNOSIS — Z79899 Other long term (current) drug therapy: Secondary | ICD-10-CM | POA: Insufficient documentation

## 2020-11-22 DIAGNOSIS — I10 Essential (primary) hypertension: Secondary | ICD-10-CM | POA: Diagnosis present

## 2020-11-22 DIAGNOSIS — N183 Chronic kidney disease, stage 3 unspecified: Secondary | ICD-10-CM | POA: Diagnosis not present

## 2020-11-22 DIAGNOSIS — I639 Cerebral infarction, unspecified: Secondary | ICD-10-CM | POA: Diagnosis present

## 2020-11-22 DIAGNOSIS — Z8673 Personal history of transient ischemic attack (TIA), and cerebral infarction without residual deficits: Secondary | ICD-10-CM | POA: Diagnosis not present

## 2020-11-22 DIAGNOSIS — Z950 Presence of cardiac pacemaker: Secondary | ICD-10-CM | POA: Insufficient documentation

## 2020-11-22 DIAGNOSIS — R200 Anesthesia of skin: Secondary | ICD-10-CM | POA: Diagnosis present

## 2020-11-22 DIAGNOSIS — I129 Hypertensive chronic kidney disease with stage 1 through stage 4 chronic kidney disease, or unspecified chronic kidney disease: Secondary | ICD-10-CM | POA: Diagnosis not present

## 2020-11-22 LAB — CBC
HCT: 40.9 % (ref 39.0–52.0)
Hemoglobin: 13.9 g/dL (ref 13.0–17.0)
MCH: 29.1 pg (ref 26.0–34.0)
MCHC: 34 g/dL (ref 30.0–36.0)
MCV: 85.7 fL (ref 80.0–100.0)
Platelets: 207 10*3/uL (ref 150–400)
RBC: 4.77 MIL/uL (ref 4.22–5.81)
RDW: 14.6 % (ref 11.5–15.5)
WBC: 5.4 10*3/uL (ref 4.0–10.5)
nRBC: 0 % (ref 0.0–0.2)

## 2020-11-22 LAB — TROPONIN I (HIGH SENSITIVITY): Troponin I (High Sensitivity): 4 ng/L (ref <18)

## 2020-11-22 LAB — CBG MONITORING, ED
Glucose-Capillary: 99 mg/dL (ref 70–99)
Glucose-Capillary: 121 mg/dL — ABNORMAL HIGH (ref 70–99)

## 2020-11-22 LAB — HEMOGLOBIN A1C
Hgb A1c MFr Bld: 5.1 % (ref 4.8–5.6)
Mean Plasma Glucose: 99.67 mg/dL

## 2020-11-22 LAB — BASIC METABOLIC PANEL
Anion gap: 8 (ref 5–15)
BUN: 17 mg/dL (ref 8–23)
CO2: 26 mmol/L (ref 22–32)
Calcium: 9.8 mg/dL (ref 8.9–10.3)
Chloride: 107 mmol/L (ref 98–111)
Creatinine, Ser: 1.36 mg/dL — ABNORMAL HIGH (ref 0.61–1.24)
GFR, Estimated: 55 mL/min — ABNORMAL LOW (ref >60)
Glucose, Bld: 102 mg/dL — ABNORMAL HIGH (ref 70–99)
Potassium: 3.6 mmol/L (ref 3.5–5.1)
Sodium: 141 mmol/L (ref 135–145)

## 2020-11-22 LAB — RESP PANEL BY RT-PCR (FLU A&B, COVID) ARPGX2
Influenza A by PCR: NEGATIVE
Influenza B by PCR: NEGATIVE
SARS Coronavirus 2 by RT PCR: NEGATIVE

## 2020-11-22 LAB — GLUCOSE, CAPILLARY: Glucose-Capillary: 89 mg/dL (ref 70–99)

## 2020-11-22 MED ORDER — IOHEXOL 350 MG/ML SOLN
75.0000 mL | Freq: Once | INTRAVENOUS | Status: AC | PRN
  Administered 2020-11-22: 75 mL via INTRAVENOUS

## 2020-11-22 MED ORDER — INSULIN ASPART 100 UNIT/ML IJ SOLN
0.0000 [IU] | INTRAMUSCULAR | Status: DC
  Administered 2020-11-22: 2 [IU] via SUBCUTANEOUS
  Filled 2020-11-22: qty 1

## 2020-11-22 MED ORDER — NIACIN 500 MG PO TABS
500.0000 mg | ORAL_TABLET | Freq: Every day | ORAL | Status: DC
  Administered 2020-11-22: 22:00:00 500 mg via ORAL
  Filled 2020-11-22 (×2): qty 1

## 2020-11-22 MED ORDER — CARBAMAZEPINE ER 200 MG PO TB12
200.0000 mg | ORAL_TABLET | Freq: Two times a day (BID) | ORAL | Status: DC
  Administered 2020-11-22 – 2020-11-23 (×3): 200 mg via ORAL
  Filled 2020-11-22 (×5): qty 1

## 2020-11-22 MED ORDER — ACETAMINOPHEN 325 MG PO TABS: 650.0000 mg | ORAL_TABLET | ORAL | Status: DC | PRN

## 2020-11-22 MED ORDER — ACETAMINOPHEN 160 MG/5ML PO SOLN
650.0000 mg | ORAL | Status: DC | PRN
  Filled 2020-11-22: qty 20.3

## 2020-11-22 MED ORDER — ACETAMINOPHEN 650 MG RE SUPP: 650.0000 mg | RECTAL | Status: DC | PRN

## 2020-11-22 MED ORDER — ASPIRIN 81 MG PO CHEW
324.0000 mg | CHEWABLE_TABLET | Freq: Once | ORAL | Status: AC
  Administered 2020-11-22: 324 mg via ORAL
  Filled 2020-11-22: qty 4

## 2020-11-22 MED ORDER — STROKE: EARLY STAGES OF RECOVERY BOOK: Freq: Once | Status: DC

## 2020-11-22 MED ORDER — ASPIRIN EC 81 MG PO TBEC
81.0000 mg | DELAYED_RELEASE_TABLET | Freq: Every day | ORAL | Status: DC
  Administered 2020-11-23: 11:00:00 81 mg via ORAL
  Filled 2020-11-22: qty 1

## 2020-11-22 MED ORDER — ATORVASTATIN CALCIUM 20 MG PO TABS
80.0000 mg | ORAL_TABLET | Freq: Every day | ORAL | Status: DC
  Administered 2020-11-22: 80 mg via ORAL
  Filled 2020-11-22: qty 4

## 2020-11-22 NOTE — ED Provider Notes (Signed)
[facility] Emergency Department Provider Note    Event Date/Time   First MD Initiated Contact with Patient 11/22/20 [phone]     (approximate)  I have reviewed the triage vital signs and the nursing notes.   HISTORY  Chief Complaint Numbness    HPI Eric Andersen is a 75 y.o. male with the below listed past medical history not on aspirin Plavix or blood thinners presents to the ER for evaluation of intermittent tingling numbness and weakness of the right hand fingers and right face.  Symptoms he think initially started on Wednesday lasting roughly 15 to 20 minutes.  Yesterday he had a couple more episodes left lasting again between 15 and 30 minutes.  Denies any word finding difficulty.  He would find that when he was reaching for something with his right hand he would drop it.  Had another episode at 4 AM this morning when he was getting up which brought him to the ER.  He denies any pain.  No shortness of breath.  Does not smoke.  Does have history of diabetes hyperlipidemia and hypertension.  Past Medical History:  Diagnosis Date   Arthritis    Diabetes mellitus without complication (HCC)    History of kidney stones    Hypercholesterolemia    Hypertension    Sleep apnea    Stroke [facility])    No family history on file. Past Surgical History:  Procedure Laterality Date   CARDIAC CATHETERIZATION     COLONOSCOPY WITH PROPOFOL N/A 01/11/2019   Procedure: COLONOSCOPY WITH PROPOFOL;  Surgeon: Toledo, Boykin Nearing, MD;  Location: [facility] ENDOSCOPY;  Service: Gastroenterology;  Laterality: N/A;   PACEMAKER INSERTION Left 06/23/2017   Procedure: INSERTION PACEMAKER INITIAL INSERT-DUAL CHAMBER;  Surgeon: Marcina Millard, MD;  Location: [facility] ORS;  Service: Cardiovascular;  Laterality: Left;   PACEMAKER LEAD REMOVAL N/A 06/23/2017   Procedure: PACEMAKER LEAD REVISION;  Surgeon: Marcina Millard, MD;  Location: [facility] ORS;  Service: Cardiovascular;  Laterality: N/A;    TEMPORARY PACEMAKER N/A 06/22/2017   Procedure: TEMPORARY PACEMAKER;  Surgeon: Alwyn Pea, MD;  Location: [facility] INVASIVE CV LAB;  Service: Cardiovascular;  Laterality: N/A;   Patient Active Problem List   Diagnosis Date Noted   Bradycardia 06/22/2017      Prior to Admission medications   Medication Sig Start Date End Date Taking? Authorizing Provider  amLODipine (NORVASC) 10 MG tablet Take 10 mg by mouth daily.    [provider]  atorvastatin (LIPITOR) 80 MG tablet Take 80 mg by mouth daily.    [provider]  carbamazepine (TEGRETOL XR) 200 MG 12 hr tablet Take 200 mg by mouth 2 (two) times daily.    [provider]  furosemide (LASIX) 20 MG tablet Take 20 mg by mouth daily.     [provider]  glipiZIDE (GLUCOTROL XL) 2.5 MG 24 hr tablet Take 2.5 mg by mouth daily with breakfast.    [provider]  linagliptin (TRADJENTA) 5 MG TABS tablet Take 5 mg by mouth daily.    [provider]  metFORMIN (GLUCOPHAGE) 1000 MG tablet Take 500 mg by mouth 2 (two) times daily with a meal.    [provider]  metoprolol succinate (TOPROL-XL) 50 MG 24 hr tablet Take 50 mg by mouth daily. Take with or immediately following a meal.    [provider]  niacin 500 MG tablet Take 500 mg by mouth at bedtime.    [provider]  olmesartan (  BENICAR) 40 MG tablet Take 40 mg by mouth daily.    [provider]  traMADol (ULTRAM) 50 MG tablet Take 1 tablet (50 mg total) by mouth every 6 (six) hours. Take as needed after 24 hours Patient not taking: Reported on 01/11/2019 06/25/17   Enedina Finner, MD    Allergies Diovan [valsartan], Micardis [telmisartan], Ace inhibitors, and Hydrochlorothiazide    Social History Social History   Tobacco Use   Smoking status: Former    Pack years: 0.00   Smokeless tobacco: Never  Vaping Use   Vaping Use: Never used  Substance Use Topics   Alcohol use: No   Drug use: Not  Currently    Review of Systems Patient denies headaches, rhinorrhea, blurry vision, numbness, shortness of breath, chest pain, edema, cough, abdominal pain, nausea, vomiting, diarrhea, dysuria, fevers, rashes or hallucinations unless otherwise stated above in HPI. ____________________________________________   PHYSICAL EXAM:  VITAL SIGNS: Vitals:   11/22/20 0536  BP: (!) 186/97  Pulse: 72  Resp: 18  Temp: 98.1 F (36.7 C)  SpO2: 98%    Constitutional: Alert and oriented.  Eyes: Conjunctivae are normal.  Head: Atraumatic. Nose: No congestion/rhinnorhea. Mouth/Throat: Mucous membranes are moist.   Neck: No stridor. Painless ROM.  Cardiovascular: Normal rate, regular rhythm. Grossly normal heart sounds.  Good peripheral circulation. Respiratory: Normal respiratory effort.  No retractions. Lungs CTAB. Gastrointestinal: Soft and nontender. No distention. No abdominal bruits. No CVA tenderness. Genitourinary:  Musculoskeletal: No lower extremity tenderness nor edema.  No joint effusions. Neurologic:  Normal speech and language. No gross focal neurologic deficits are appreciated. No facial droop Skin:  Skin is warm, dry and intact. No rash noted. Psychiatric: Mood and affect are normal. Speech and behavior are normal.  ____________________________________________   LABS (all labs ordered are listed, but only abnormal results are displayed)  Results for orders placed or performed during the hospital encounter of 11/22/20 (from the past 24 hour(s))  CBC     Status: None   Collection Time: 11/22/20  5:43 AM  Result Value Ref Range   WBC 5.4 4.0 - 10.5 K/uL   RBC 4.77 4.22 - 5.81 MIL/uL   Hemoglobin 13.9 13.0 - 17.0 g/dL   HCT 79.3 90.3 - 00.9 %   MCV 85.7 80.0 - 100.0 fL   MCH 29.1 26.0 - 34.0 pg   MCHC 34.0 30.0 - 36.0 g/dL   RDW 23.3 00.7 - 62.2 %   Platelets 207 150 - 400 K/uL   nRBC 0.0 0.0 - 0.2 %  Basic metabolic panel     Status: Abnormal   Collection Time:  11/22/20  5:43 AM  Result Value Ref Range   Sodium 141 135 - 145 mmol/L   Potassium 3.6 3.5 - 5.1 mmol/L   Chloride 107 98 - 111 mmol/L   CO2 26 22 - 32 mmol/L   Glucose, Bld 102 (H) 70 - 99 mg/dL   BUN 17 8 - 23 mg/dL   Creatinine, Ser 6.33 (H) 0.61 - 1.24 mg/dL   Calcium 9.8 8.9 - 35.4 mg/dL   GFR, Estimated 55 (L) >60 mL/min   Anion gap 8 5 - 15  Troponin I (High Sensitivity)     Status: None   Collection Time: 11/22/20  5:43 AM  Result Value Ref Range   Troponin I (High Sensitivity) 4 <18 ng/L  Resp Panel by RT-PCR (Flu A&B, Covid) Nasopharyngeal Swab     Status: None   Collection Time: 11/22/20  7:55 AM  Specimen: Nasopharyngeal Swab; Nasopharyngeal(NP) swabs in vial transport medium  Result Value Ref Range   SARS Coronavirus 2 by RT PCR NEGATIVE NEGATIVE   Influenza A by PCR NEGATIVE NEGATIVE   Influenza B by PCR NEGATIVE NEGATIVE   ____________________________________________  EKG My review and personal interpretation at Time: 5:42   Indication: tia  Rate: 70  Rhythm: a-s v-p Axis: left Other: no sgarbossa, abnml ekg ____________________________________________  RADIOLOGY  I personally reviewed all radiographic images ordered to evaluate for the above acute complaints and reviewed radiology reports and findings.  These findings were personally discussed with the patient.  Please see medical record for radiology report.  ____________________________________________   PROCEDURES  Procedure(s) performed:  Procedures    Critical Care performed: no ____________________________________________   INITIAL IMPRESSION / ASSESSMENT AND PLAN / ED COURSE  Pertinent labs & imaging results that were available during my care of the patient were reviewed by me and considered in my medical decision making (see chart for details).   DDX: cva, tia, hypoglycemia, dehydration, electrolyte abnormality, dissection, sepsis   Eric Andersen is a 75 y.o. who presents to the ED  with presentation as described above.  Patient well-appearing with reassuring exam upon arrival.  His history of presenting symptoms is concerning for TIA however the patient does have multiple risk factors not on aspirin and Plavix.  Presentation complicated as he is pacemaker limiting her ability to order MRI.  Will consult neurology for further recommendations  Clinical Course as of 11/22/20 0904  Caleen Essex Nov 22, 2020  7425 This case in consultation with neurology recommends CTA and agrees with plan to admit to hospitalist for TIA work-up. [PR]    Clinical Course User Index [PR] Willy Eddy, MD    The patient was evaluated in Emergency Department today for the symptoms described in the history of present illness. He/she was evaluated in the context of the global COVID-19 pandemic, which necessitated consideration that the patient might be at risk for infection with the SARS-CoV-2 virus that causes COVID-19. Institutional protocols and algorithms that pertain to the evaluation of patients at risk for COVID-19 are in a state of rapid change based on information released by regulatory bodies including the CDC and federal and state organizations. These policies and algorithms were followed during the patient's care in the ED.  As part of my medical decision making, I reviewed the following data within the electronic MEDICAL RECORD NUMBER Nursing notes reviewed and incorporated, Labs reviewed, notes from prior ED visits and [place] Controlled Substance Database   ____________________________________________   FINAL CLINICAL IMPRESSION(S) / ED DIAGNOSES  Final diagnoses:  TIA (transient ischemic attack)      NEW MEDICATIONS STARTED DURING THIS VISIT:  New Prescriptions   No medications on file     Note:  This document was prepared using Dragon voice recognition software and may include unintentional dictation errors.    Willy Eddy, MD 11/22/20 [phone]

## 2020-11-22 NOTE — Progress Notes (Signed)
*  PRELIMINARY RESULTS* Echocardiogram 2D Echocardiogram has been performed.  Cristela Blue 11/22/2020, 1:13 PM

## 2020-11-22 NOTE — Consult Note (Signed)
NEUROLOGY CONSULTATION NOTE   Date of service: November 22, 2020 Patient Name: Eric Andersen MRN:  [id number] DOB:  [birth year] Reason for consult: recurrent episodes R face and R arm numbness x3 days _ _ _   _ __   _ __ _ _  __ __   _ __   __ _  History of Present Illness   This is a patient with a history of prior left temporal occipital intracerebral hemorrhage in 2004 with no focal deficits but subsequent seizures well-controlled on carbamazepine, diabetes, dyslipidemia, hypertension, status post pacemaker insertion who presents to the emergency department for evaluation of multiple spells of numbness involving the right face and right hand over the past 3 days.  Episodes have completely resolved in between.  He may have had some associated right-handed weakness during one of the events as he mentioned that he dropped a guitar back that he was holding.  His last episode of this occurred this morning at 4 AM and resolved when he was in the emergency department.  Head CT showed no acute intracranial process.  CTA head and neck showed no LVO but did show multiple regions of moderate intracranial atherosclerotic stenosis.  Patient reports no new focal neurologic deficits or other complaints at this time. He has baseline limited ROM in RLE 2/2 arthritis which is unchanged.  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  He has had no seizures since that time on carbamazepine XR 200mg  q 12 hrs.   ROS   Per HPI. All other systems reviewed and are negative  Past History   Past Medical History:  Diagnosis Date   Arthritis    Diabetes mellitus without complication (HCC)    History of kidney stones    Hypercholesterolemia    Hypertension    Sleep apnea    Stroke [facility])    Past Surgical History:  Procedure Laterality Date   CARDIAC CATHETERIZATION     COLONOSCOPY WITH PROPOFOL N/A 01/11/2019   Procedure: COLONOSCOPY WITH  PROPOFOL;  Surgeon: Toledo, 01/13/2019, MD;  Location: [facility] ENDOSCOPY;  Service: Gastroenterology;  Laterality: N/A;   PACEMAKER INSERTION Left 06/23/2017   Procedure: INSERTION PACEMAKER INITIAL INSERT-DUAL CHAMBER;  Surgeon: 08/21/2017, MD;  Location: [facility] ORS;  Service: Cardiovascular;  Laterality: Left;   PACEMAKER LEAD REMOVAL N/A 06/23/2017   Procedure: PACEMAKER LEAD REVISION;  Surgeon: 08/21/2017, MD;  Location: [facility] ORS;  Service: Cardiovascular;  Laterality: N/A;   TEMPORARY PACEMAKER N/A 06/22/2017   Procedure: TEMPORARY PACEMAKER;  Surgeon: 08/20/2017, MD;  Location: [facility] INVASIVE CV LAB;  Service: Cardiovascular;  Laterality: N/A;   Family History  Problem Relation Age of Onset   Stroke Father    Social History   Socioeconomic History   Marital status: Married    Spouse name: Not on file   Number of children: Not on file   Years of education: Not on file   Highest education level: Not on file  Occupational History   Not on file  Tobacco Use   Smoking status: Former    Pack years: 0.00   Smokeless tobacco: Never  Vaping Use   Vaping Use: Never used  Substance and Sexual Activity   Alcohol use: No   Drug use: Not Currently   Sexual activity: Not on file  Other Topics Concern   Not on file  Social History Narrative   Not on file   Social Determinants of Health  Financial Resource Strain: Not on file  Food Insecurity: Not on file  Transportation Needs: Not on file  Physical Activity: Not on file  Stress: Not on file  Social Connections: Not on file   Allergies  Allergen Reactions   Celecoxib Other (See Comments)    KIDNEY FAILURE    Clonidine     BRADYCARDIA   Diovan [Valsartan] Cough   Micardis [Telmisartan] Cough   Ace Inhibitors Rash   Hydrochlorothiazide Rash    Medications   (Not in a hospital admission)    Vitals   Vitals:   11/22/20 0536 11/22/20 1130  BP: (!) 186/97 (!) 145/96  Pulse: 72 (!) 59  Resp: 18    Temp: 98.1 F (36.7 C)   TempSrc: Oral   SpO2: 98% 97%  Weight: 90.3 kg   Height: 6' (1.829 m)      Body mass index is 26.99 kg/m.  Physical Exam   Physical Exam Gen: A&O x4, NAD HEENT: Atraumatic, normocephalic;mucous membranes moist; oropharynx clear, tongue without atrophy or fasciculations. Neck: Supple, trachea midline. Resp: CTAB, no w/r/r CV: RRR, no m/g/r; nml S1 and S2. 2+ symmetric peripheral pulses. Abd: soft/NT/ND; nabs x 4 quad Extrem: Nml bulk; no cyanosis, clubbing, or edema.  Neuro: *MS: A&O x4. Follows multi-step commands.  *Speech: fluid, nondysarthric, able to name and repeat *CN:    I: Deferred   II,III: PERRLA, VFF by confrontation, optic discs not visualized 2/2 pupillary constriction   III,IV,VI: EOMI w/o nystagmus, no ptosis   V: Sensation intact from V1 to V3 to LT   VII: Eyelid closure was full.  L NLF flattening   VIII: Hearing intact to voice   IX,X: Voice normal, palate elevates symmetrically    XI: SCM/trap 5/5 bilat   XII: Tongue protrudes midline, no atrophy or fasciculations   *Motor:   Normal bulk.  No tremor, rigidity or bradykinesia. No pronator drift. All extremities 5/5 strength except RLE exam pain-limited 2/2 chronic arthritis.  *Sensory: Intact to light touch, pinprick, temperature vibration throughout. Symmetric. Propioception intact bilat.  No double-simultaneous extinction.  *Coordination:  Finger-to-nose, heel-to-shin, rapid alternating motions were intact. *Reflexes:  2+ and symmetric throughout without clonus; toes down-going bilat *Gait: normal base, normal stride, normal turn. Negative Romberg.  NIHSS = 2 for facial droop and RLE drift   Premorbid mRS = 2 (arthritis)   Labs   CBC:  Recent Labs  Lab 11/22/20 0543  WBC 5.4  HGB 13.9  HCT 40.9  MCV 85.7  PLT 207    Basic Metabolic Panel:  Lab Results  Component Value Date   NA 141 11/22/2020   K 3.6 11/22/2020   CO2 26 11/22/2020   GLUCOSE 102 (H)  11/22/2020   BUN 17 11/22/2020   CREATININE 1.36 (H) 11/22/2020   CALCIUM 9.8 11/22/2020   GFRNONAA 55 (L) 11/22/2020   GFRAA >60 06/23/2017   Lipid Panel: No results found for: LDLCALC HgbA1c:  Lab Results  Component Value Date   HGBA1C 6.8 (H) 06/22/2017   Urine Drug Screen: No results found for: LABOPIA, COCAINSCRNUR, LABBENZ, AMPHETMU, THCU, LABBARB  Alcohol Level No results found for: [facility]   Impression   This is a patient with a history of prior left temporal occipital intracerebral hemorrhage in 2004 with no focal deficits but subsequent seizures well-controlled on carbamazepine, diabetes, dyslipidemia, hypertension, status post pacemaker insertion who presents to the emergency department for evaluation of multiple spells of numbness involving the right face and right hand over the  past 3 days c/f TIA (favored) vs seizure.  Recommendations   - Admit to hospitalist service for stroke w/u - Permissive HTN x48 hrs from sx onset or until stroke ruled out by MRI goal BP <220/110. PRN labetalol or hydralazine if BP above these parameters. Avoid oral antihypertensives. - MRI brain wo contrast if MR compatible PM - CTA performed - TTE  - Check A1c and LDL + add statin per guidelines - ASA 325mg  ASA x1 f/b 81mg  daily after - q4 hr neuro checks - STAT head CT for any change in neuro exam - Tele - Stroke education - Amb referral to neurology upon discharge  - CBZ level ordered  Will continue to follow ______________________________________________________________________   Thank you for the opportunity to take part in the care of this patient. If you have any further questions, please contact the neurology consultation attending.  Signed,  , MD [facility] [phone]  If 7pm- 7am, please page neurology on call as listed in AMION.

## 2020-11-22 NOTE — Progress Notes (Signed)
OT Cancellation Note  Patient Details Name: Eric Andersen MRN: [id number] DOB: June 30, 1945   Cancelled Treatment:    Reason Eval/Treat Not Completed: OT screened, no needs identified, will sign off. OT order received and chart reviewed. Pt seen in ED. Per PT, caregiver, and RN , pt is up ad lib in room, able to complete toileting independently. Pt reporting symptoms have generally resolved. Denies any ongoing strength, sensory, or visual deficits. Reports feeling at baseline for ADL management. BE FAST stroke education provided. Handout given. No skilled OT needs identified. OT will sign off at this time. Please re-consult if additional OT needs arise during this admission.   Rockney Ghee, M.S., OTR/L Ascom: [phone] 11/22/20, 3:34 PM

## 2020-11-22 NOTE — ED Notes (Signed)
Pt resting . VSS. NAD. Wife at bedside. Call light within reach. Will continue to monitor.

## 2020-11-22 NOTE — H&P (Signed)
She currently reports a lot of proceeding she can orderif there is no endoleak and wanted nurses can order C. difficile History and Physical    Eric Andersen [name] DOB: 01-09-1946 DOA: 11/22/2020  PCP: Eric Sato, MD   Patient coming from: Home  I have personally briefly reviewed patient's old medical records in [facility]  Chief Complaint: Numbness  HPI: Eric Andersen is a 75 y.o. male with medical history significant for diabetes mellitus, dyslipidemia, hypertension, history of CVA with no focal deficits, status post pacemaker insertion who presents to the ER for evaluation of numbness involving the finger tips on the right hand as well as his lips. Symptoms have been going on for 3 days and is intermittent. He also complains of weakness in his right hand and dropped an item 2 days ago. He denies having any focal deficits, no blurred vision, no headaches, no difficulty swallowing, no cough, no fever, no chills, no abdominal pain, no nausea, no vomiting, no urinary symptoms, no constipation, no palpitations, no diaphoresis, no chest pain, no shortness of breath. Labs show sodium 141, potassium 3.6, chloride 107, bicarb 26, glucose 102, BUN 17, creatinine 1.36, troponin 4, white count 5.4, hemoglobin 13.9, hematocrit 40.9, MCV 85.7, RDW 14.6, platelet count 207 Respiratory viral panel is negative CT scan of the head without contrast shows no acute finding.Chronic small vessel ischemia in the hemispheric white matter. CTA of the head and neck shows no intracranial large vessel occlusion. Intracranial atherosclerotic disease multifocal stenoses, most notably as follows. Up to moderate stenosis within the cavernous internal carotid arteries bilaterally Moderate stenosis within a right PCA branch at the P2/P3 junction. Twelve-lead EKG shows a paced rhythm.    ED Course: Patient is a 75 year old African-American male who presents to the ER for evaluation of a 2-day history of  intermittent tingling and numbness involving the fingers on his right hand as well as his lips.  Initial CT scan of the head without contrast is negative for hemorrhage. He will be referred to observation status for further evaluation.   Review of Systems: As per HPI otherwise all other systems reviewed and negative.    Past Medical History:  Diagnosis Date   Arthritis    Diabetes mellitus without complication (HCC)    History of kidney stones    Hypercholesterolemia    Hypertension    Sleep apnea    Stroke [facility])     Past Surgical History:  Procedure Laterality Date   CARDIAC CATHETERIZATION     COLONOSCOPY WITH PROPOFOL N/A 01/11/2019   Procedure: COLONOSCOPY WITH PROPOFOL;  Surgeon: Eric Andersen, Boykin Nearing, MD;  Location: [facility] ENDOSCOPY;  Service: Gastroenterology;  Laterality: N/A;   PACEMAKER INSERTION Left 06/23/2017   Procedure: INSERTION PACEMAKER INITIAL INSERT-DUAL CHAMBER;  Surgeon: Eric Millard, MD;  Location: [facility] ORS;  Service: Cardiovascular;  Laterality: Left;   PACEMAKER LEAD REMOVAL N/A 06/23/2017   Procedure: PACEMAKER LEAD REVISION;  Surgeon: Eric Millard, MD;  Location: [facility] ORS;  Service: Cardiovascular;  Laterality: N/A;   TEMPORARY PACEMAKER N/A 06/22/2017   Procedure: TEMPORARY PACEMAKER;  Surgeon: Eric Pea, MD;  Location: [facility] INVASIVE CV LAB;  Service: Cardiovascular;  Laterality: N/A;     reports that he has quit smoking. He has never used smokeless tobacco. He reports previous drug use. He reports that he does not drink alcohol.  Allergies  Allergen Reactions   Celecoxib Other (See Comments)    KIDNEY FAILURE    Clonidine  BRADYCARDIA   Diovan [Valsartan] Cough   Micardis [Telmisartan] Cough   Ace Inhibitors Rash   Hydrochlorothiazide Rash    Family History  Problem Relation Age of Onset   Stroke Father       Prior to Admission medications   Medication Sig Start Date End Date Taking? Authorizing Provider  amLODipine  (NORVASC) 10 MG tablet Take 10 mg by mouth daily.    [provider]  atorvastatin (LIPITOR) 80 MG tablet Take 80 mg by mouth daily.    [provider]  carbamazepine (TEGRETOL XR) 200 MG 12 hr tablet Take 200 mg by mouth 2 (two) times daily.    [provider]  furosemide (LASIX) 20 MG tablet Take 20 mg by mouth daily.     [provider]  glipiZIDE (GLUCOTROL XL) 2.5 MG 24 hr tablet Take 2.5 mg by mouth daily with breakfast.    [provider]  linagliptin (TRADJENTA) 5 MG TABS tablet Take 5 mg by mouth daily.    [provider]  metFORMIN (GLUCOPHAGE) 1000 MG tablet Take 500 mg by mouth 2 (two) times daily with a meal.    [provider]  metoprolol succinate (TOPROL-XL) 50 MG 24 hr tablet Take 50 mg by mouth daily. Take with or immediately following a meal.    [provider]  niacin 500 MG tablet Take 500 mg by mouth at bedtime.    [provider]  olmesartan (BENICAR) 40 MG tablet Take 40 mg by mouth daily.    [provider]  traMADol (ULTRAM) 50 MG tablet Take 1 tablet (50 mg total) by mouth every 6 (six) hours. Take as needed after 24 hours Patient not taking: Reported on 01/11/2019 06/25/17   Eric Finner, MD    Physical Exam: Vitals:   11/22/20 0536  BP: (!) 186/97  Pulse: 72  Resp: 18  Temp: 98.1 F (36.7 C)  TempSrc: Oral  SpO2: 98%  Weight: 90.3 kg  Height: 6' (1.829 m)     Vitals:   11/22/20 0536  BP: (!) 186/97  Pulse: 72  Resp: 18  Temp: 98.1 F (36.7 C)  TempSrc: Oral  SpO2: 98%  Weight: 90.3 kg  Height: 6' (1.829 m)      Constitutional: Alert and oriented x 3 . Not in any apparent distress HEENT:      Head: Normocephalic and atraumatic.         Eyes: PERLA, EOMI, Conjunctivae are normal. Sclera is non-icteric.       Mouth/Throat: Mucous membranes are moist.       Neck: Supple with no signs of meningismus. Cardiovascular: Regular rate and rhythm. No murmurs,  gallops, or rubs. 2+ symmetrical distal pulses are present . No JVD. No LE edema Respiratory: Respiratory effort normal .Lungs sounds clear bilaterally. No wheezes, crackles, or rhonchi.  Gastrointestinal: Soft, non tender, and non distended with positive bowel sounds.  Genitourinary: No CVA tenderness. Musculoskeletal: Nontender with normal range of motion in all extremities . No cyanosis, or erythema of extremities. Neurologic:  Face is symmetric. Moving all extremities. No gross focal neurologic deficits.Able to move all extremities Skin: Skin is warm, dry.  No rash or ulcers Psychiatric: Mood and affect are normal    Labs on Admission: I have personally reviewed following labs and imaging studies  CBC: Recent Labs  Lab 11/22/20 0543  WBC 5.4  HGB 13.9  HCT 40.9  MCV 85.7  PLT 207   Basic Metabolic Panel: Recent Labs  Lab 11/22/20 0543  NA 141  K 3.6  CL 107  CO2 26  GLUCOSE 102*  BUN 17  CREATININE 1.36*  CALCIUM 9.8   GFR: Estimated Creatinine Clearance: 53.1 mL/min (A) (by C-G formula based on SCr of 1.36 mg/dL (H)). Liver Function Tests: No results for input(s): AST, ALT, ALKPHOS, BILITOT, PROT, ALBUMIN in the last 168 hours. No results for input(s): LIPASE, AMYLASE in the last 168 hours. No results for input(s): AMMONIA in the last 168 hours. Coagulation Profile: No results for input(s): INR, PROTIME in the last 168 hours. Cardiac Enzymes: No results for input(s): CKTOTAL, CKMB, CKMBINDEX, TROPONINI in the last 168 hours. BNP (last 3 results) No results for input(s): PROBNP in the last 8760 hours. HbA1C: No results for input(s): HGBA1C in the last 72 hours. CBG: No results for input(s): GLUCAP in the last 168 hours. Lipid Profile: No results for input(s): CHOL, HDL, LDLCALC, TRIG, CHOLHDL, LDLDIRECT in the last 72 hours. Thyroid Function Tests: No results for input(s): TSH, T4TOTAL, FREET4, T3FREE, THYROIDAB in the last 72 hours. Anemia Panel: No  results for input(s): VITAMINB12, FOLATE, FERRITIN, TIBC, IRON, RETICCTPCT in the last 72 hours. Urine analysis: No results found for: COLORURINE, APPEARANCEUR, LABSPEC, PHURINE, GLUCOSEU, HGBUR, BILIRUBINUR, KETONESUR, PROTEINUR, UROBILINOGEN, NITRITE, LEUKOCYTESUR  Radiological Exams on Admission: CT ANGIO HEAD NECK W WO CM  Result Date: 11/22/2020 CLINICAL DATA:  Stroke/TIA, assess intracranial arteries; stroke/TIA, assess extracranial arteries. Additional history provided: Intermittent tingling, numbness and weakness in right hand and fingers as well as right face. EXAM: CT ANGIOGRAPHY HEAD AND NECK TECHNIQUE: Multidetector CT imaging of the head and neck was performed using the standard protocol during bolus administration of intravenous contrast. Multiplanar CT image reconstructions and MIPs were obtained to evaluate the vascular anatomy. Carotid stenosis measurements (when applicable) are obtained utilizing NASCET criteria, using the distal internal carotid diameter as the denominator. CONTRAST:  75mL OMNIPAQUE IOHEXOL 350 MG/ML SOLN COMPARISON:  Head CT performed earlier today 11/22/2020. FINDINGS: CT HEAD FINDINGS Brain: Cerebral volume is unremarkable for age. Mild patchy and ill-defined hypoattenuation within the cerebral white matter, nonspecific but compatible chronic small vessel ischemic disease. There is no acute intracranial hemorrhage. No demarcated cortical infarct. No extra-axial fluid collection. No evidence of an intracranial mass. No midline shift. Vascular: No hyperdense vessel.  Atherosclerotic calcifications. Skull: Normal. Negative for fracture or focal lesion. Sinuses: Moderate volume frothy secretions within the left sphenoid sinus. Orbits: No mass or acute finding. Review of the MIP images confirms the above findings CTA NECK FINDINGS Aortic arch: Standard aortic branching. The visualized aortic arch is normal in caliber. Atherosclerotic plaque within the visualized aortic arch  and proximal major branch vessels of the neck. Streak artifact from a dense right-sided contrast bolus partially obscures the right subclavian artery. Within this limitation, there is no appreciable hemodynamically significant stenosis of the innominate or proximal subclavian arteries. Right carotid system: CCA and ICA patent within the neck without stenosis or significant atherosclerotic disease. Left carotid system: CCA and ICA patent within the neck without stenosis or significant atherosclerotic disease. Vertebral arteries: Vertebral arteries patent within the neck without stenosis. The left vertebral artery is dominant. Skeleton: No acute bony abnormality or aggressive osseous lesion. Other neck: Mildly prominent thyroid gland. The thyroid gland contains multiple subcentimeter thyroid nodules, not meeting consensus criteria for ultrasound follow-up based on size. No cervical lymphadenopathy. 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 Upper chest: No consolidation within the imaged lung apices.  Partially imaged left chest implantable cardiac device. Review of the MIP images confirms the above findings CTA HEAD FINDINGS Anterior circulation: The intracranial internal carotid arteries are patent. Calcified plaque within both vessels with up to moderate stenosis within the bilateral cavernous segments. Atherosclerotic irregularity of the M2 and more distal middle cerebral artery branches bilaterally. No M2 proximal branch occlusion or high-grade proximal stenosis is identified. The anterior cerebral arteries are patent. Posterior circulation: The intracranial vertebral arteries are patent. The basilar artery is patent. The posterior cerebral arteries are patent. Moderate stenosis within right PCA branch at the P2/P3 junction (series 15, image 22). A left posterior communicating artery is present. The right posterior communicating artery is hypoplastic or absent. Venous  sinuses: Within the limitations of contrast timing, no convincing thrombus. Anatomic variants: None significant Review of the MIP images confirms the above findings IMPRESSION: CT head: 1. No evidence of acute intracranial abnormality. 2. Mild chronic small vessel ischemic changes within the cerebral white matter. 3. Left sphenoid sinusitis. CTA neck: 1. The common carotid, internal carotid and vertebral arteries are patent within the neck without stenosis. No significant atherosclerotic disease within these vessels. 2. Mildly prominent thyroid gland. Correlate with relevant laboratory values. CTA head: 1. No intracranial large vessel occlusion. 2. Intracranial atherosclerotic disease multifocal stenoses, most notably as follows. 3. Up to moderate stenosis within the cavernous internal carotid arteries bilaterally 4. Moderate stenosis within a right PCA branch at the P2/P3 junction. Electronically Signed   By: Jackey Loge DO   On: 11/22/2020 10:35   CT Head Wo Contrast  Result Date: 11/22/2020 CLINICAL DATA:  Numbness or tingling to right finger tips, lower lip, and right jaw. EXAM: CT HEAD WITHOUT CONTRAST TECHNIQUE: Contiguous axial images were obtained from the base of the skull through the vertex without intravenous contrast. COMPARISON:  06/24/2004 FINDINGS: Brain: No evidence of acute infarction, hemorrhage, hydrocephalus, extra-axial collection or mass lesion/mass effect. Patchy low-density in the cerebral white matter attributed to chronic small vessel ischemia. Vascular: No hyperdense vessel or unexpected calcification. Skull: Normal. Negative for fracture or focal lesion. Sinuses/Orbits: No acute finding. IMPRESSION: 1. No acute finding. 2. Chronic small vessel ischemia in the hemispheric white matter. Electronically Signed   By: Marnee Spring M.D.   On: 11/22/2020 06:59     Assessment/Plan Principal Problem:   TIA (transient ischemic attack) Active Problems:   Hypertension   Type 2 diabetes  mellitus with stage 3 chronic kidney disease (HCC)   Stroke (HCC)      TIA rule out an acute stroke Patient presents to the ER for evaluation of numbness and tingling involving the fingers on his right hand and lips. Symptoms have been intermittent lasting about 2 days Initial CT scan of the head contrast is negative for bleed Unable to obtain MRI of the brain since patient has a pacemaker Obtain 2D echocardiogram to assess LVEF and rule out intracardiac thrombus Consult ST/PT/OT Place patient on aspirin and high intensity statins Allow for permissive hypertension until an acute stroke is ruled out    Diabetes mellitus with complications of stage III chronic kidney disease Hold oral hypoglycemic agents Glycemic control with sliding scale insulin    Hypertension Hold antihypertensive medications until an acute stroke is ruled    DVT prophylaxis: SCD Code Status: full code  Family Communication: Greater than 50% of time was spent discussing patient's condition and plan of care with patient and his wife at the bedside.  All questions and concerns have been addressed.  They verbalized understanding and agree with the plan. Disposition Plan: Back to previous home environment Consults called: Neurology  Status: Observation    Shaina Gullatt MD [facility]     11/22/2020, 11:25 AM

## 2020-11-22 NOTE — ED Notes (Signed)
OT in to evaluate pt

## 2020-11-22 NOTE — Evaluation (Signed)
Physical Therapy Evaluation Patient Details Name: Eric Andersen MRN: [id number] DOB: 1945-10-10 Today's Date: 11/22/2020   History of Present Illness  Pt is a 75 y/o M who presented to the ED on 11/22/20 with c/o numbness in finger tips on R hand & lips intermittently x 3 days. Pt also with c/o R hand weakness.   Head CT showed no acute intracranial process.  CTA head and neck showed no LVO but did show multiple regions of moderate intracranial atherosclerotic stenosis. PMH: DM, dyslipidemia, HTN, hx of CVA with no focal deficits, s/p packemaker insertion, arthritis, sleep apnea  Clinical Impression  Pt seen for PT evaluation with wife present for session. Pt reports he's independent without AD prior to admission. Pt's wife notes RLE weakness that seems to correlate with tingling/numbness in fingers & pt having to hold to objects for support but pt reports hx of R hip OA. Pt is able to ambulate around nurses station without AD with antalgic gait RLE, but notes this is baseline gait. Educated pt on ability to use [facility] or RW for support when hip OA pain increases. At this time, pt does not demonstrate any acute PT needs. PT to sign off at this time.     Follow Up Recommendations No PT follow up    Equipment Recommendations  None recommended by PT    Recommendations for Other Services       Precautions / Restrictions Precautions Precautions: None Restrictions Weight Bearing Restrictions: No      Mobility  Bed Mobility  Not observed, pt received & left sitting on EOB.                 Transfers Overall transfer level: Independent                  Ambulation/Gait Ambulation/Gait assistance: Independent Gait Distance (Feet): 150 Feet Assistive device: None   Gait velocity: slightly decreased   General Gait Details: antalgic R 2/2 hx of R hip OA  Stairs            Wheelchair Mobility    Modified Rankin (Stroke Patients Only)       Balance Overall balance  assessment: Mild deficits observed, not formally tested                                           Pertinent Vitals/Pain Pain Assessment: Faces Faces Pain Scale: Hurts a little bit Pain Location: R hip Pain Descriptors / Indicators: Aching Pain Intervention(s): Monitored during session    Home Living Family/patient expects to be discharged to:: Private residence Living Arrangements: Spouse/significant other Available Help at Discharge: Family Type of Home: House Home Access: Stairs to enter Entrance Stairs-Rails: Right (at front door) Secretary/administrator of Steps: 4 Home Layout: One level Home Equipment: Environmental consultant - 2 wheels;Cane - single point      Prior Function Level of Independence: Independent               Hand Dominance        Extremity/Trunk Assessment   Upper Extremity Assessment Upper Extremity Assessment: Overall WFL for tasks assessed    Lower Extremity Assessment Lower Extremity Assessment: Overall WFL for tasks assessed       Communication   Communication: No difficulties  Cognition Arousal/Alertness: Awake/alert Behavior During Therapy: WFL for tasks assessed/performed Overall Cognitive Status: Within Functional Limits for tasks assessed  General Comments      Exercises     Assessment/Plan    PT Assessment Patent does not need any further PT services  PT Problem List         PT Treatment Interventions      PT Goals (Current goals can be found in the Care Plan section)  Acute Rehab PT Goals Patient Stated Goal: none stated PT Goal Formulation: With patient Time For Goal Achievement: 12/06/20 Potential to Achieve Goals: Good    Frequency     Barriers to discharge        Co-evaluation               AM-PAC PT "6 Clicks" Mobility  Outcome Measure Help needed turning from your back to your side while in a flat bed without using bedrails?:  None Help needed moving from lying on your back to sitting on the side of a flat bed without using bedrails?: None Help needed moving to and from a bed to a chair (including a wheelchair)?: None Help needed standing up from a chair using your arms (e.g., wheelchair or bedside chair)?: None Help needed to walk in hospital room?: None Help needed climbing 3-5 steps with a railing? : None 6 Click Score: 24    End of Session   Activity Tolerance: Patient tolerated treatment well Patient left: in bed;with family/visitor present Nurse Communication: Mobility status      Time: 1497-0263 PT Time Calculation (min) (ACUTE ONLY): 8 min   Charges:   PT Evaluation $PT Eval Low Complexity: 1 Low          Aleda Grana, PT, DPT 11/22/20, 2:59 PM   Sandi Mariscal 11/22/2020, 2:57 PM

## 2020-11-22 NOTE — ED Triage Notes (Signed)
Pt to triage via w/c with no distress noted; reports since yesterday having numbness to rt fingertips, lower lip and rt jaw; st hx CVA several yrs ago and has pacemaker in place; pt A&Ox3, PERRL, MAEW, grips = & strong

## 2020-11-22 NOTE — Progress Notes (Signed)
SLP Cancellation Note  Patient Details Name: MALIEK SCHELLHORN MRN: [id number] DOB: 1946/03/11   Cancelled treatment:       Reason Eval/Treat Not Completed: SLP screened, no needs identified, will sign off (chart reviewed, met w/ pt, Wife in room). Pt denied any difficulty swallowing and is currently on a regular diet after passing the [place] swallow screen w/ NSG; tolerates swallowing pills w/ water per pt/NSG. Pt conversed in conversation w/out overt expressive/receptive deficits noted; pt denied any speech-language deficits. Speech clear. Wife agreed. Discussed pt's affinity of the [employer], baseball.  No further skilled ST services indicated as pt appears at his baseline. Pt agreed. NSG to reconsult if any change in status while admitted.       Orinda Kenner, MS, CCC-SLP Speech Language Pathologist Rehab Services [phone] [facility] 11/22/2020, 11:50 AM

## 2020-11-22 NOTE — Progress Notes (Signed)
   11/22/20 1515  Clinical Encounter Type  Visited With Patient and family together  Visit Type Initial;Spiritual support;Social support;ED  Referral From Other (Comment) (rounding)  Spiritual Encounters  Spiritual Needs Prayer  Chaplain Burris visited briefly with Eric Andersen. PT appeared to be resting comfortably and both he and spouse expressed no needs at this time other than desire to be remembered in prayer. Luna Fuse said that prayer would indeed be offered and wished them continued well-being and recovery.

## 2020-11-23 ENCOUNTER — Observation Stay: Payer: Medicare HMO

## 2020-11-23 DIAGNOSIS — G459 Transient cerebral ischemic attack, unspecified: Secondary | ICD-10-CM | POA: Diagnosis not present

## 2020-11-23 LAB — ECHOCARDIOGRAM COMPLETE
AR max vel: 2.46 cm2
AV Area VTI: 3.3 cm2
AV Area mean vel: 2.7 cm2
AV Mean grad: 2 mmHg
AV Peak grad: 3.1 mmHg
Ao pk vel: 0.88 m/s
Area-P 1/2: 3.08 cm2
Height: 72 in
S' Lateral: 2.34 cm
Weight: 3184 oz

## 2020-11-23 LAB — LIPID PANEL
Cholesterol: 132 mg/dL (ref 0–200)
HDL: 43 mg/dL (ref >40)
LDL Cholesterol: 77 mg/dL (ref 0–99)
Total CHOL/HDL Ratio: 3.1 RATIO
Triglycerides: 59 mg/dL (ref <150)
VLDL: 12 mg/dL (ref 0–40)

## 2020-11-23 LAB — GLUCOSE, CAPILLARY
Glucose-Capillary: 80 mg/dL (ref 70–99)
Glucose-Capillary: 90 mg/dL (ref 70–99)
Glucose-Capillary: 114 mg/dL — ABNORMAL HIGH (ref 70–99)

## 2020-11-23 LAB — IRON AND TIBC
Iron: 69 ug/dL (ref 45–182)
Saturation Ratios: 25 % (ref 17.9–39.5)
TIBC: 279 ug/dL (ref 250–450)
UIBC: 210 ug/dL

## 2020-11-23 LAB — HEMOGLOBIN A1C
Hgb A1c MFr Bld: 6.1 % — ABNORMAL HIGH (ref 4.8–5.6)
Mean Plasma Glucose: 128.37 mg/dL

## 2020-11-23 LAB — VITAMIN D 25 HYDROXY (VIT D DEFICIENCY, FRACTURES): Vit D, 25-Hydroxy: 40.91 ng/mL (ref 30–100)

## 2020-11-23 LAB — FOLATE: Folate: 16.3 ng/mL (ref >5.9)

## 2020-11-23 LAB — VITAMIN B12: Vitamin B-12: 563 pg/mL (ref 180–914)

## 2020-11-23 MED ORDER — ASPIRIN 81 MG PO TBEC: 81.0000 mg | DELAYED_RELEASE_TABLET | Freq: Every day | ORAL | 11 refills | Status: AC

## 2020-11-23 NOTE — Discharge Summary (Signed)
[facility] Discharge Summary   Patient: Eric Andersen KJZ:[id number]  PCP: Leanna Sato, MD  Date of admission: 11/22/2020   Date of discharge:  11/23/2020     Discharge Diagnoses:  Principal Problem:   TIA (transient ischemic attack) Active Problems:   Hypertension   Type 2 diabetes mellitus with stage 3 chronic kidney disease (HCC)   Stroke (HCC)   Admitted From: Home Disposition:  Home   Recommendations for Outpatient Follow-up:  PCP: in 1 wk Neurology in 1 to 2 weeks Follow up LABS/TEST:     Diet recommendation: Cardiac and Carb modified diet  Activity: The patient is advised to gradually reintroduce usual activities, as tolerated  Discharge Condition: stable  Code Status: Full code   History of present illness: As per the H and P dictated on admission Eric Andersen is a 75 y.o. male with medical history significant for diabetes mellitus, dyslipidemia, hypertension, history of CVA with no focal deficits, status post pacemaker insertion who presents to the ER for evaluation of numbness involving the finger tips on the right hand as well as his lips. Symptoms have been going on for 3 days and is intermittent. He also complains of weakness in his right hand and dropped an item 2 days ago. He denies having any focal deficits, no blurred vision, no headaches, no difficulty swallowing, no cough, no fever, no chills, no abdominal pain, no nausea, no vomiting, no urinary symptoms, no constipation, no palpitations, no diaphoresis, no chest pain, no shortness of breath. Labs show sodium 141, potassium 3.6, chloride 107, bicarb 26, glucose 102, BUN 17, creatinine 1.36, troponin 4, white count 5.4, hemoglobin 13.9, hematocrit 40.9, MCV 85.7, RDW 14.6, platelet count 207. Respiratory viral panel is negative CT scan of the head without contrast shows no acute finding.Chronic small vessel ischemia in the hemispheric white matter. CTA of the head and neck shows no intracranial large  vessel occlusion. Intracranial atherosclerotic disease multifocal stenoses, most notably as follows. Up to moderate stenosis within the cavernous internal carotid arteries bilaterally Moderate stenosis within a right PCA branch at the P2/P3 junction. Twelve-lead EKG shows a paced rhythm. Hospital Course:  # TIA ruled out acute stroke, Patient presents to the ER for evaluation of numbness and tingling involving the fingers on his right hand and lips. Symptoms have been intermittent lasting about 2 days.  Currently patient is asymptomatic. Initial CT scan of the head contrast is negative for bleed. MRI of the brain could not be done since patient has a pacemaker.  Repeated CT scan head which was negative.  Patient was cleared by neurology to discharge home on aspirin and statin.  TTE ruled out intracardiac source of thrombus, no documented PFO.  Patient was recommended to follow with neurology as an outpatient.  Lipid profile LDL 77, hemoglobin A1c 6.1.  # Diabetes mellitus with complications of stage III chronic kidney disease, resumed oral meds on discharge.  Patient was advised to follow with PCP for further management # Hypertension, permissive hypertension was allowed.  Resumed home medication at discharge.  Patient was advised to monitor BP at home and follow with PCP for further management. Body mass index is 26.99 kg/m.    - Patient was instructed, not to drive, operate heavy machinery, perform activities at heights, swimming or participation in water activities or provide baby sitting services while on Pain, Sleep and Anxiety Medications; until his outpatient Physician has advised to do so again.  - Also recommended to not to take  more than prescribed Pain, Sleep and Anxiety Medications.  Patient was ambulatory without any assistance. Patient was seen by physical therapy, who recommended no needs. On the day of the discharge the patient's vitals were stable, and no other acute medical  condition were reported by patient. the patient was felt safe to be discharge at Home.  Consultants: Neurology Procedures: None  Discharge Exam: General: Appear in no distress, no Rash; Oral Mucosa Clear, moist. Cardiovascular: S1 and S2 Present, no Murmur, Respiratory: normal respiratory effort, Bilateral Air entry present and no Crackles, no wheezes Abdomen: Bowel Sound present, Soft and no tenderness, no hernia Extremities: no Pedal edema, no calf tenderness Neurology: alert and oriented to time, place, and person affect appropriate.  Filed Weights   11/22/20 0536  Weight: 90.3 kg   Vitals:   11/23/20 0459 11/23/20 0735  BP: (!) 152/92 (!) 154/93  Pulse: 61 60  Resp: 16 18  Temp: 97.7 F (36.5 C) 97.9 F (36.6 C)  SpO2: 96% 98%    DISCHARGE MEDICATION: Allergies as of 11/23/2020       Reactions   Celecoxib Other (See Comments)   KIDNEY FAILURE   Clonidine    BRADYCARDIA   Diovan [valsartan] Cough   Micardis [telmisartan] Cough   Ace Inhibitors Rash   Hydrochlorothiazide Rash        Medication List     TAKE these medications    amLODipine 10 MG tablet Commonly known as: NORVASC Take 10 mg by mouth daily.   aspirin 81 MG EC tablet Take 1 tablet (81 mg total) by mouth daily. Swallow whole. Start taking on: November 24, 2020   atorvastatin 80 MG tablet Commonly known as: LIPITOR Take 80 mg by mouth daily.   carbamazepine 200 MG 12 hr tablet Commonly known as: TEGRETOL XR Take 200 mg by mouth 2 (two) times daily.   furosemide 20 MG tablet Commonly known as: LASIX Take 20 mg by mouth daily.   glipiZIDE 2.5 MG 24 hr tablet Commonly known as: GLUCOTROL XL Take 2.5 mg by mouth daily with breakfast.   linagliptin 5 MG Tabs tablet Commonly known as: TRADJENTA Take 5 mg by mouth daily.   metFORMIN 1000 MG tablet Commonly known as: GLUCOPHAGE Take 500 mg by mouth 2 (two) times daily with a meal.   metoprolol succinate 50 MG 24 hr tablet Commonly  known as: TOPROL-XL Take 50 mg by mouth daily. Take with or immediately following a meal.   niacin 500 MG tablet Take 500 mg by mouth at bedtime.   olmesartan 40 MG tablet Commonly known as: BENICAR Take 40 mg by mouth daily.       Allergies  Allergen Reactions   Celecoxib Other (See Comments)    KIDNEY FAILURE    Clonidine     BRADYCARDIA   Diovan [Valsartan] Cough   Micardis [Telmisartan] Cough   Ace Inhibitors Rash   Hydrochlorothiazide Rash   Discharge Instructions     Ambulatory referral to Neurology   Complete by: As directed    An appointment is requested in approximately: 6   Diet - low sodium heart healthy   Complete by: As directed    Discharge instructions   Complete by: As directed    Follow-up with PCP in 1 week, monitor blood pressure at home and follow with PCP to titrate medication accordingly. Follow with neurology in 1 to 2 weeks as an outpatient   Increase activity slowly   Complete by: As directed  The results of significant diagnostics from this hospitalization (including imaging, microbiology, ancillary and laboratory) are listed below for reference.    Significant Diagnostic Studies: CT ANGIO HEAD NECK W WO CM  Result Date: 11/22/2020 CLINICAL DATA:  Stroke/TIA, assess intracranial arteries; stroke/TIA, assess extracranial arteries. Additional history provided: Intermittent tingling, numbness and weakness in right hand and fingers as well as right face. EXAM: CT ANGIOGRAPHY HEAD AND NECK TECHNIQUE: Multidetector CT imaging of the head and neck was performed using the standard protocol during bolus administration of intravenous contrast. Multiplanar CT image reconstructions and MIPs were obtained to evaluate the vascular anatomy. Carotid stenosis measurements (when applicable) are obtained utilizing NASCET criteria, using the distal internal carotid diameter as the denominator. CONTRAST:  75mL OMNIPAQUE IOHEXOL 350 MG/ML SOLN COMPARISON:  Head  CT performed earlier today 11/22/2020. FINDINGS: CT HEAD FINDINGS Brain: Cerebral volume is unremarkable for age. Mild patchy and ill-defined hypoattenuation within the cerebral white matter, nonspecific but compatible chronic small vessel ischemic disease. There is no acute intracranial hemorrhage. No demarcated cortical infarct. No extra-axial fluid collection. No evidence of an intracranial mass. No midline shift. Vascular: No hyperdense vessel.  Atherosclerotic calcifications. Skull: Normal. Negative for fracture or focal lesion. Sinuses: Moderate volume frothy secretions within the left sphenoid sinus. Orbits: No mass or acute finding. Review of the MIP images confirms the above findings CTA NECK FINDINGS Aortic arch: Standard aortic branching. The visualized aortic arch is normal in caliber. Atherosclerotic plaque within the visualized aortic arch and proximal major branch vessels of the neck. Streak artifact from a dense right-sided contrast bolus partially obscures the right subclavian artery. Within this limitation, there is no appreciable hemodynamically significant stenosis of the innominate or proximal subclavian arteries. Right carotid system: CCA and ICA patent within the neck without stenosis or significant atherosclerotic disease. Left carotid system: CCA and ICA patent within the neck without stenosis or significant atherosclerotic disease. Vertebral arteries: Vertebral arteries patent within the neck without stenosis. The left vertebral artery is dominant. Skeleton: No acute bony abnormality or aggressive osseous lesion. Other neck: Mildly prominent thyroid gland. The thyroid gland contains multiple subcentimeter thyroid nodules, not meeting consensus criteria for ultrasound follow-up based on size. No cervical lymphadenopathy. 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 Upper chest: No consolidation within the imaged lung apices. Partially imaged left chest  implantable cardiac device. Review of the MIP images confirms the above findings CTA HEAD FINDINGS Anterior circulation: The intracranial internal carotid arteries are patent. Calcified plaque within both vessels with up to moderate stenosis within the bilateral cavernous segments. Atherosclerotic irregularity of the M2 and more distal middle cerebral artery branches bilaterally. No M2 proximal branch occlusion or high-grade proximal stenosis is identified. The anterior cerebral arteries are patent. Posterior circulation: The intracranial vertebral arteries are patent. The basilar artery is patent. The posterior cerebral arteries are patent. Moderate stenosis within right PCA branch at the P2/P3 junction (series 15, image 22). A left posterior communicating artery is present. The right posterior communicating artery is hypoplastic or absent. Venous sinuses: Within the limitations of contrast timing, no convincing thrombus. Anatomic variants: None significant Review of the MIP images confirms the above findings IMPRESSION: CT head: 1. No evidence of acute intracranial abnormality. 2. Mild chronic small vessel ischemic changes within the cerebral white matter. 3. Left sphenoid sinusitis. CTA neck: 1. The common carotid, internal carotid and vertebral arteries are patent within the neck without stenosis. No significant atherosclerotic disease within these vessels. 2.  Mildly prominent thyroid gland. Correlate with relevant laboratory values. CTA head: 1. No intracranial large vessel occlusion. 2. Intracranial atherosclerotic disease multifocal stenoses, most notably as follows. 3. Up to moderate stenosis within the cavernous internal carotid arteries bilaterally 4. Moderate stenosis within a right PCA branch at the P2/P3 junction. Electronically Signed   By: Jackey LogeKyle  Golden DO   On: 11/22/2020 10:35   CT HEAD WO CONTRAST  Result Date: 11/23/2020 CLINICAL DATA:  Neuro deficit, RIGHT hand numbness, intermittent for the  past week. EXAM: CT HEAD WITHOUT CONTRAST TECHNIQUE: Contiguous axial images were obtained from the base of the skull through the vertex without intravenous contrast. COMPARISON:  Head CT dated 11/22/2020. FINDINGS: Brain: Again noted are chronic small vessel ischemic changes within the periventricular and subcortical white matter regions bilaterally. No mass, hemorrhage, edema or other evidence of acute parenchymal abnormality. No extra-axial hemorrhage. Vascular: Chronic calcified atherosclerotic changes of the large vessels at the skull base. No unexpected hyperdense vessel. Skull: Normal. Negative for fracture or focal lesion. Sinuses/Orbits: No acute finding. Other: None. IMPRESSION: 1. No acute findings. No intracranial mass, hemorrhage or edema. 2. Chronic small vessel ischemic changes in the white matter. Electronically Signed   By: Bary RichardStan  Maynard M.D.   On: 11/23/2020 12:27   CT Head Wo Contrast  Result Date: 11/22/2020 CLINICAL DATA:  Numbness or tingling to right finger tips, lower lip, and right jaw. EXAM: CT HEAD WITHOUT CONTRAST TECHNIQUE: Contiguous axial images were obtained from the base of the skull through the vertex without intravenous contrast. COMPARISON:  06/24/2004 FINDINGS: Brain: No evidence of acute infarction, hemorrhage, hydrocephalus, extra-axial collection or mass lesion/mass effect. Patchy low-density in the cerebral white matter attributed to chronic small vessel ischemia. Vascular: No hyperdense vessel or unexpected calcification. Skull: Normal. Negative for fracture or focal lesion. Sinuses/Orbits: No acute finding. IMPRESSION: 1. No acute finding. 2. Chronic small vessel ischemia in the hemispheric white matter. Electronically Signed   By: Marnee SpringJonathon  Watts M.D.   On: 11/22/2020 06:59   DG Chest Port 1 View  Result Date: 11/23/2020 CLINICAL DATA:  Numbness in fingers. EXAM: PORTABLE CHEST 1 VIEW COMPARISON:  06/23/2017 chest radiograph. FINDINGS: Pacer with leads at right  atrium and right ventricle. No lead discontinuity. Midline trachea. Normal heart size for level of inspiration. No pleural effusion or pneumothorax. Clear lungs. Numerous leads and wires project over the chest. IMPRESSION: No acute cardiopulmonary disease. Electronically Signed   By: Jeronimo GreavesKyle  Talbot M.D.   On: 11/23/2020 09:41   ECHOCARDIOGRAM COMPLETE  Result Date: 11/23/2020    ECHOCARDIOGRAM REPORT   Patient Name:   Eric Andersen Date of Exam: 11/22/2020 Medical Rec #:  [id number]      Height:       72.0 in Accession #:    [id number]     Weight:       199.0 lb Date of Birth:  09/22/1945      BSA:          2.126 m Patient Age:    75 years       BP:           145/96 mmHg Patient Gender: M              HR:           59 bpm. Exam Location:  [facility] Procedure: 2D Echo, Cardiac Doppler and Color Doppler Indications:     Stroke I63.9  History:         Patient has  prior history of Echocardiogram examinations, most                  recent 06/23/2017. Risk Factors:Hypertension and Diabetes.  Sonographer:     Cristela Blue RDCS (AE) Referring Phys:  [id number] Lucile Shutters Diagnosing Phys: Arnoldo Hooker MD  Sonographer Comments: Suboptimal apical window. IMPRESSIONS  1. Left ventricular ejection fraction, by estimation, is 35 to 40%. The left ventricle has moderately decreased function. The left ventricle demonstrates regional wall motion abnormalities (see scoring diagram/findings for description). Left ventricular  diastolic parameters were normal.  2. Right ventricular systolic function is normal. The right ventricular size is normal.  3. The mitral valve is normal in structure. Mild mitral valve regurgitation.  4. The aortic valve is normal in structure. Aortic valve regurgitation is not visualized. FINDINGS  Left Ventricle: Left ventricular ejection fraction, by estimation, is 35 to 40%. The left ventricle has moderately decreased function. The left ventricle demonstrates regional wall motion abnormalities. Moderate hypokinesis  of the left ventricular, apical apical segment. The left ventricular internal cavity size was normal in size. There is no left ventricular hypertrophy. Left ventricular diastolic parameters were normal. Right Ventricle: The right ventricular size is normal. No increase in right ventricular wall thickness. Right ventricular systolic function is normal. Left Atrium: Left atrial size was normal in size. Right Atrium: Right atrial size was normal in size. Pericardium: There is no evidence of pericardial effusion. Mitral Valve: The mitral valve is normal in structure. Mild mitral valve regurgitation. Tricuspid Valve: The tricuspid valve is normal in structure. Tricuspid valve regurgitation is mild. Aortic Valve: The aortic valve is normal in structure. Aortic valve regurgitation is not visualized. Aortic valve mean gradient measures 2.0 mmHg. Aortic valve peak gradient measures 3.1 mmHg. Aortic valve area, by VTI measures 3.30 cm. Pulmonic Valve: The pulmonic valve was normal in structure. Pulmonic valve regurgitation is not visualized. Aorta: The aortic root and ascending aorta are structurally normal, with no evidence of dilitation. IAS/Shunts: No atrial level shunt detected by color flow Doppler.  LEFT VENTRICLE PLAX 2D LVIDd:         3.23 cm  Diastology LVIDs:         2.34 cm  LV e' medial:    3.92 cm/s LV PW:         1.44 cm  LV E/e' medial:  13.0 LV IVS:        1.29 cm  LV e' lateral:   9.14 cm/s LVOT diam:     2.00 cm  LV E/e' lateral: 5.6 LV SV:         46 LV SV Index:   22 LVOT Area:     3.14 cm  RIGHT VENTRICLE RV Basal diam:  3.00 cm RV S prime:     15.80 cm/s TAPSE (M-mode): 4.1 cm LEFT ATRIUM             Index       RIGHT ATRIUM           Index LA diam:        4.60 cm 2.16 cm/m  RA Area:     13.10 cm LA Vol (A2C):   30.4 ml 14.30 ml/m RA Volume:   25.80 ml  12.14 ml/m LA Vol (A4C):   42.8 ml 20.13 ml/m LA Biplane Vol: 36.1 ml 16.98 ml/m  AORTIC VALVE                   PULMONIC VALVE AV Area (  Vmax):     2.46 cm    PV Vmax:        0.46 m/s AV Area (Vmean):   2.70 cm    PV Peak grad:   0.8 mmHg AV Area (VTI):     3.30 cm    RVOT Peak grad: 1 mmHg AV Vmax:           87.50 cm/s AV Vmean:          57.200 cm/s AV VTI:            0.140 m AV Peak Grad:      3.1 mmHg AV Mean Grad:      2.0 mmHg LVOT Vmax:         68.50 cm/s LVOT Vmean:        49.200 cm/s LVOT VTI:          0.147 m LVOT/AV VTI ratio: 1.05  AORTA Ao Root diam: 3.75 cm MITRAL VALVE               TRICUSPID VALVE MV Area (PHT): 3.08 cm    TR Peak grad:   24.2 mmHg MV Decel Time: 246 msec    TR Vmax:        246.00 cm/s MV E velocity: 51.00 cm/s MV A velocity: 81.00 cm/s  SHUNTS MV E/A ratio:  0.63        Systemic VTI:  0.15 m                            Systemic Diam: 2.00 cm Arnoldo Hooker MD Electronically signed by Arnoldo Hooker MD Signature Date/Time: 11/23/2020/7:47:01 AM    Final     Microbiology: Recent Results (from the past 240 hour(s))  Resp Panel by RT-PCR (Flu A&B, Covid) Nasopharyngeal Swab     Status: None   Collection Time: 11/22/20  7:55 AM   Specimen: Nasopharyngeal Swab; Nasopharyngeal(NP) swabs in vial transport medium  Result Value Ref Range Status   SARS Coronavirus 2 by RT PCR NEGATIVE NEGATIVE Final    Comment: (NOTE) SARS-CoV-2 target nucleic acids are NOT DETECTED.  The SARS-CoV-2 RNA is generally detectable in upper respiratory specimens during the acute phase of infection. The lowest concentration of SARS-CoV-2 viral copies this assay can detect is 138 copies/mL. A negative result does not preclude SARS-Cov-2 infection and should not be used as the sole basis for treatment or other patient management decisions. A negative result may occur with  improper specimen collection/handling, submission of specimen other than nasopharyngeal swab, presence of viral mutation(s) within the areas targeted by this assay, and inadequate number of viral copies(<138 copies/mL). A negative result must be combined with clinical  observations, patient history, and epidemiological information. The expected result is Negative.  Fact Sheet for Patients:  BloggerCourse.com  Fact Sheet for Healthcare Providers:  SeriousBroker.it  This test is no t yet approved or cleared by the [place] FDA and  has been authorized for detection and/or diagnosis of SARS-CoV-2 by FDA under an Emergency Use Authorization (EUA). This EUA will remain  in effect (meaning this test can be used) for the duration of the COVID-19 declaration under Section 564(b)(1) of the Act, 21 U.S.C.section 360bbb-3(b)(1), unless the authorization is terminated  or revoked sooner.       Influenza A by PCR NEGATIVE NEGATIVE Final   Influenza B by PCR NEGATIVE NEGATIVE Final    Comment: (NOTE) The Xpert Xpress SARS-CoV-2/FLU/RSV plus assay is  intended as an aid in the diagnosis of influenza from Nasopharyngeal swab specimens and should not be used as a sole basis for treatment. Nasal washings and aspirates are unacceptable for Xpert Xpress SARS-CoV-2/FLU/RSV testing.  Fact Sheet for Patients: BloggerCourse.com  Fact Sheet for Healthcare Providers: SeriousBroker.it  This test is not yet approved or cleared by the [place] FDA and has been authorized for detection and/or diagnosis of SARS-CoV-2 by FDA under an Emergency Use Authorization (EUA). This EUA will remain in effect (meaning this test can be used) for the duration of the COVID-19 declaration under Section 564(b)(1) of the Act, 21 U.S.C. section 360bbb-3(b)(1), unless the authorization is terminated or revoked.  Performed at [facility], [address] [place], [place] [address]      Labs: CBC: Recent Labs  Lab 11/22/20 0543  WBC 5.4  HGB 13.9  HCT 40.9  MCV 85.7  PLT 207   Basic Metabolic Panel: Recent Labs  Lab 11/22/20 0543  NA 141  K 3.6  CL 107   CO2 26  GLUCOSE 102*  BUN 17  CREATININE 1.36*  CALCIUM 9.8   Liver Function Tests: No results for input(s): AST, ALT, ALKPHOS, BILITOT, PROT, ALBUMIN in the last 168 hours. No results for input(s): LIPASE, AMYLASE in the last 168 hours. No results for input(s): AMMONIA in the last 168 hours. Cardiac Enzymes: No results for input(s): CKTOTAL, CKMB, CKMBINDEX, TROPONINI in the last 168 hours. BNP (last 3 results) No results for input(s): BNP in the last 8760 hours. CBG: Recent Labs  Lab 11/22/20 1627 11/22/20 2100 11/23/20 0457 11/23/20 0739 11/23/20 1114  GLUCAP 121* 89 80 90 114*    Time spent: 35 minutes  Signed:  Gillis Santa  [facility]  11/23/2020 1:39 PM

## 2020-11-23 NOTE — Plan of Care (Signed)
  Problem: Education: Goal: Knowledge of disease or condition will improve Outcome: Adequate for Discharge Goal: Knowledge of secondary prevention will improve Outcome: Adequate for Discharge Goal: Knowledge of patient specific risk factors addressed and post discharge goals established will improve Outcome: Adequate for Discharge   Problem: Self-Care: Goal: Ability to participate in self-care as condition permits will improve Outcome: Adequate for Discharge Goal: Verbalization of feelings and concerns over difficulty with self-care will improve Outcome: Adequate for Discharge   Problem: Nutrition: Goal: Risk of aspiration will decrease Outcome: Adequate for Discharge Goal: Dietary intake will improve Outcome: Adequate for Discharge   Problem: Ischemic Stroke/TIA Tissue Perfusion: Goal: Complications of ischemic stroke/TIA will be minimized Outcome: Adequate for Discharge

## 2022-10-01 IMAGING — CT CT ANGIO HEAD-NECK (W OR W/O PERF)
3 of 10 series · 9 of 34 positions shown · IV contrast (omnipaque)
Comparison: Head CT performed earlier today 11/22/2020.

CLINICAL DATA: Stroke/TIA, assess intracranial arteries;
stroke/TIA, assess extracranial arteries. Additional history
provided: Intermittent tingling, numbness and weakness in right hand
and fingers as well as right face.

EXAM:
CT ANGIOGRAPHY HEAD AND NECK
TECHNIQUE: Multidetector CT imaging of the head and neck was performed using
the standard protocol during bolus administration of intravenous
contrast. Multiplanar CT image reconstructions and MIPs were
obtained to evaluate the vascular anatomy. Carotid stenosis
measurements (when applicable) are obtained utilizing NASCET
criteria, using the distal internal carotid diameter as the
denominator.
CONTRAST:  75mL OMNIPAQUE IOHEXOL 350 MG/ML SOLN

[Series 7: sagittal soft tissue · sagittal · 0.35mm/px · 1 of 58 slices shown]
[im 45/58  soft-tissue]
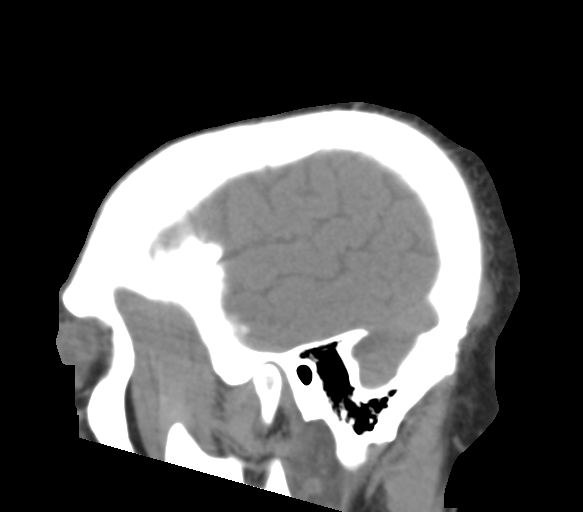

[Series 8: cta head neck · axial · 0.59mm/px · z∈[-354,-238]mm · 2 of 174 slices shown]
[im 58/174  soft-tissue]
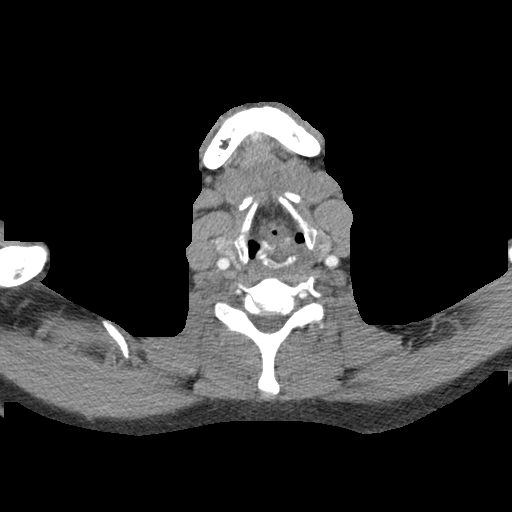
[im 116/174  soft-tissue]
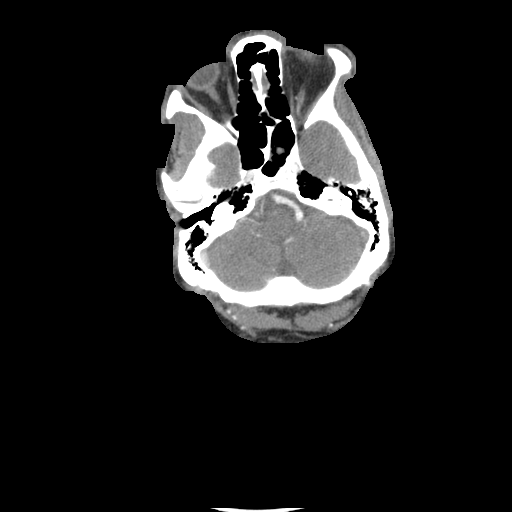

[Series 10: ax thin · axial · 0.53mm/px · z∈[-419,-174]mm · 6 of 345 slices shown]
[im 50/345  soft-tissue]
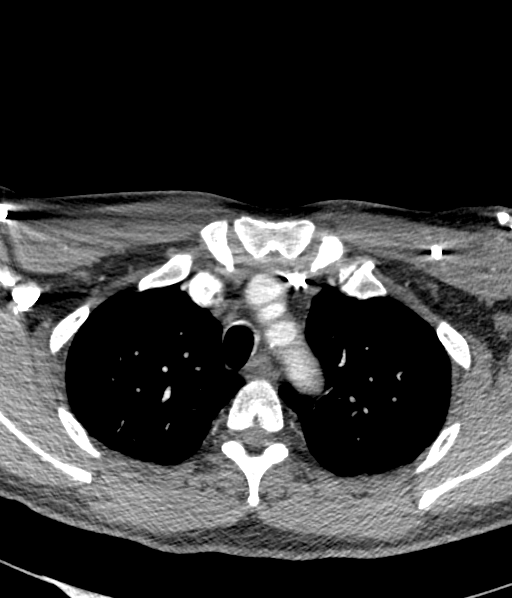
[im 99/345  bone]
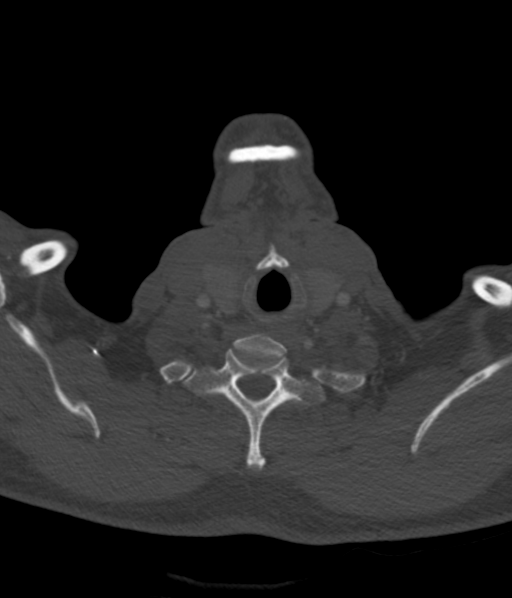
[im 148/345  soft-tissue]
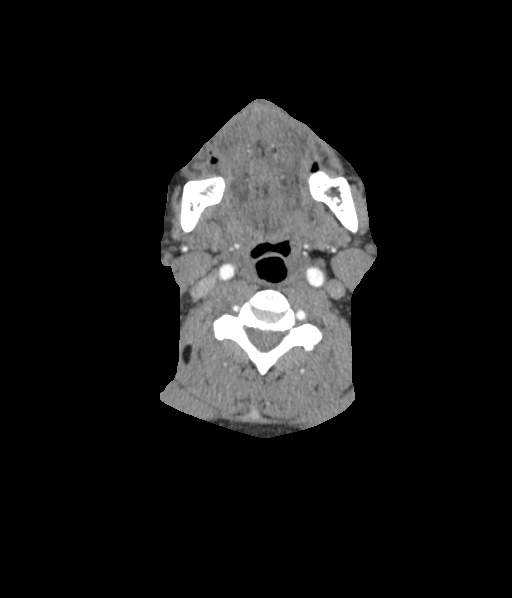
[im 197/345  bone]
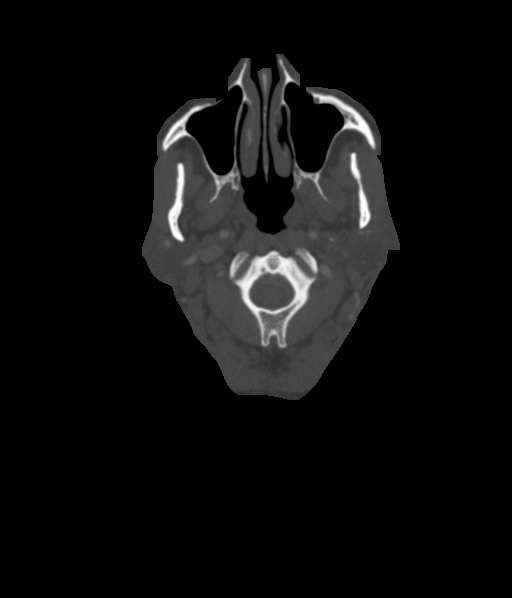
[im 246/345  soft-tissue]
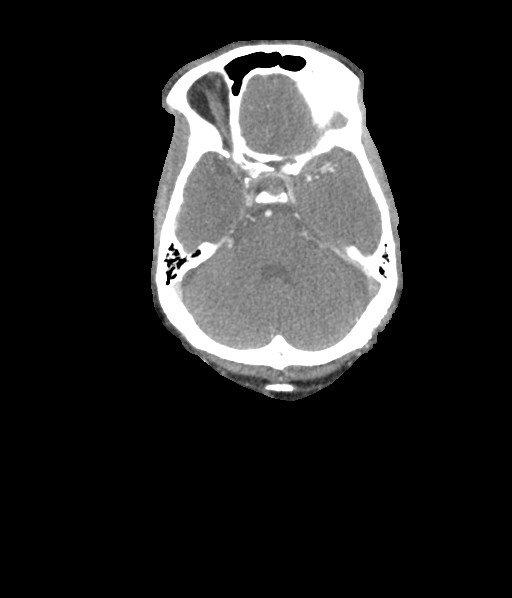
[im 295/345  bone]
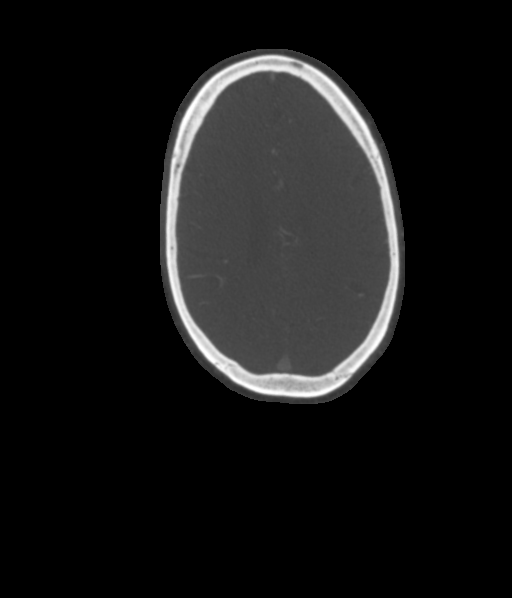

[9 of 34 positions shown; findings below may reference images not displayed]

FINDINGS: CT HEAD FINDINGS

Brain:

Cerebral volume is unremarkable for age.

Mild patchy and ill-defined hypoattenuation within the cerebral
white matter, nonspecific but compatible chronic small vessel
ischemic disease.

There is no acute intracranial hemorrhage.

No demarcated cortical infarct.

No extra-axial fluid collection.

No evidence of an intracranial mass.

No midline shift.

Vascular: No hyperdense vessel.  Atherosclerotic calcifications.

Skull: Normal. Negative for fracture or focal lesion.

Sinuses: Moderate volume frothy secretions within the left sphenoid
sinus.

Orbits: No mass or acute finding.

Review of the MIP images confirms the above findings

CTA NECK FINDINGS

Aortic arch: Standard aortic branching. The visualized aortic arch
is normal in caliber. Atherosclerotic plaque within the visualized
aortic arch and proximal major branch vessels of the neck. Streak
artifact from a dense right-sided contrast bolus partially obscures
the right subclavian artery. Within this limitation, there is no
appreciable hemodynamically significant stenosis of the innominate
or proximal subclavian arteries.

Right carotid system: CCA and ICA patent within the neck without
stenosis or significant atherosclerotic disease.

Left carotid system: CCA and ICA patent within the neck without
stenosis or significant atherosclerotic disease.

Vertebral arteries: Vertebral arteries patent within the neck
without stenosis. The left vertebral artery is dominant.

Skeleton: No acute bony abnormality or aggressive osseous lesion.

Other neck: Mildly prominent thyroid gland. The thyroid gland
contains multiple subcentimeter thyroid nodules, not meeting
consensus criteria for ultrasound follow-up based on size. No
cervical lymphadenopathy. Incidentally noted 16 mm intramuscular
lipoma within the right mid to lower neck.

Upper chest: No consolidation within the imaged lung apices.
Partially imaged left chest implantable cardiac device.

Review of the MIP images confirms the above findings

CTA HEAD FINDINGS

Anterior circulation:

The intracranial internal carotid arteries are patent. Calcified
plaque within both vessels with up to moderate stenosis within the
bilateral cavernous segments. Atherosclerotic irregularity of the M2
and more distal middle cerebral artery branches bilaterally. No M2
proximal branch occlusion or high-grade proximal stenosis is
identified. The anterior cerebral arteries are patent.

Posterior circulation:

The intracranial vertebral arteries are patent. The basilar artery
is patent. The posterior cerebral arteries are patent. Moderate
stenosis within right PCA branch at the P2/P3 junction (series 15,
image 22). A left posterior communicating artery is present. The
right posterior communicating artery is hypoplastic or absent.

Venous sinuses: Within the limitations of contrast timing, no
convincing thrombus.

Anatomic variants: None significant

Review of the MIP images confirms the above findings
IMPRESSION: CT head:

1. No evidence of acute intracranial abnormality.
2. Mild chronic small vessel ischemic changes within the cerebral
white matter.
3. Left sphenoid sinusitis.

CTA neck:

1. The common carotid, internal carotid and vertebral arteries are
patent within the neck without stenosis. No significant
atherosclerotic disease within these vessels.
2. Mildly prominent thyroid gland. Correlate with relevant
laboratory values.

CTA head:

1. No intracranial large vessel occlusion.
2. Intracranial atherosclerotic disease multifocal stenoses, most
notably as follows.
3. Up to moderate stenosis within the cavernous internal carotid
arteries bilaterally
4. Moderate stenosis within a right PCA branch at the P2/P3
junction.

## 2022-10-02 IMAGING — DX DG CHEST 1V PORT
1 series · 1 of 1 positions shown · non-contrast
Comparison: 06/23/2017 chest radiograph.

CLINICAL DATA: Numbness in fingers.

EXAM:
PORTABLE CHEST 1 VIEW

[chest ap]
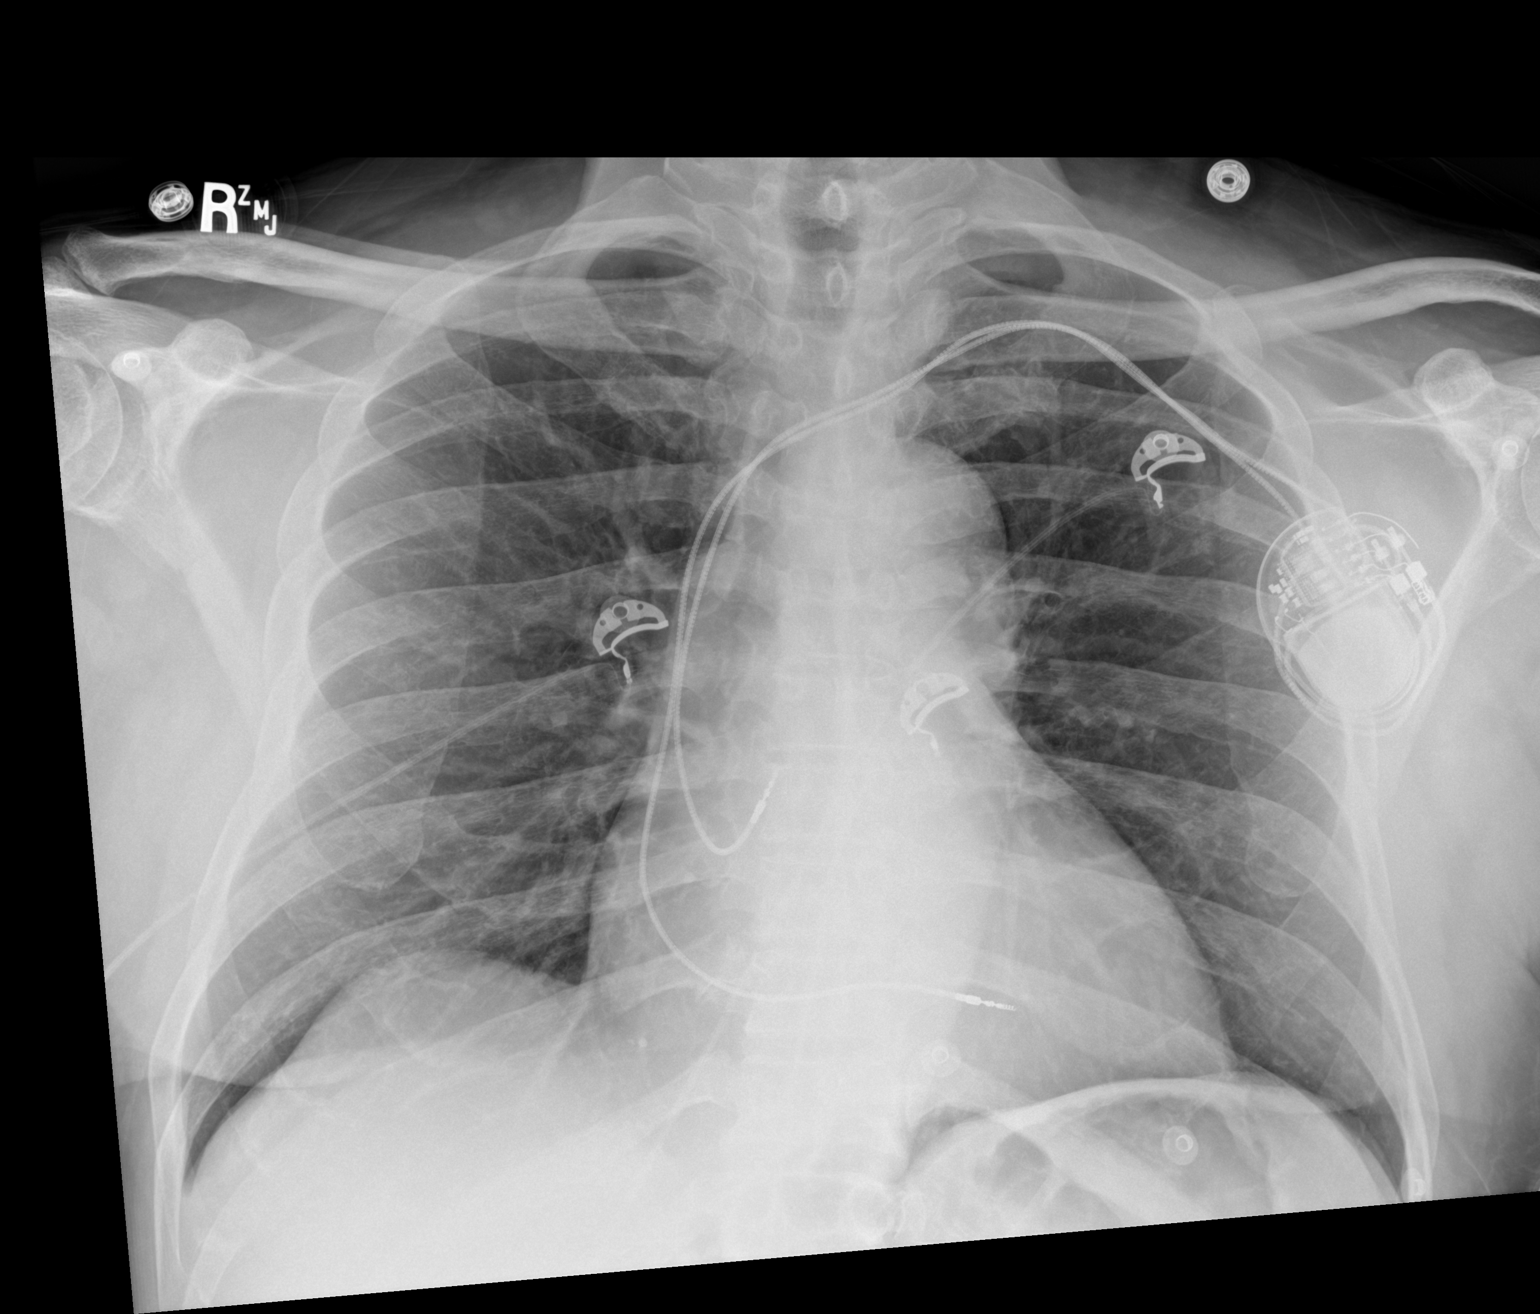

[1 of 1 positions shown; findings below may reference images not displayed]

FINDINGS: Pacer with leads at right atrium and right ventricle. No lead
discontinuity. Midline trachea. Normal heart size for level of
inspiration. No pleural effusion or pneumothorax. Clear lungs.
Numerous leads and wires project over the chest.
IMPRESSION: No acute cardiopulmonary disease.

## 2024-06-21 ENCOUNTER — Encounter
Admission: RE | Disposition: A | Discharge status: Home / Self Care | Payer: Self-pay | Source: Home / Self Care | Attending: Internal Medicine

## 2024-06-21 ENCOUNTER — Encounter: Payer: Self-pay | Admitting: Internal Medicine

## 2024-06-21 ENCOUNTER — Ambulatory Visit: Admission: RE | Admit: 2024-06-21 | Admitting: Internal Medicine

## 2024-06-21 ENCOUNTER — Ambulatory Visit: Admitting: Anesthesiology

## 2024-06-21 LAB — GLUCOSE, CAPILLARY: Glucose-Capillary: 97 mg/dL (ref 70–99)

## 2024-06-21 MED ORDER — PROPOFOL 10 MG/ML IV BOLUS
INTRAVENOUS | Status: DC | PRN
  Administered 2024-06-21 (×2): 25 mg via INTRAVENOUS
  Administered 2024-06-21: 75 mg via INTRAVENOUS
  Administered 2024-06-21: 25 mg via INTRAVENOUS

## 2024-06-21 MED ORDER — LIDOCAINE HCL (CARDIAC) PF 100 MG/5ML IV SOSY
PREFILLED_SYRINGE | INTRAVENOUS | Status: DC | PRN
  Administered 2024-06-21: 100 mg via INTRAVENOUS

## 2024-06-21 MED ORDER — LIDOCAINE HCL (PF) 2 % IJ SOLN
INTRAMUSCULAR | Status: AC
  Filled 2024-06-21: qty 5

## 2024-06-21 MED ORDER — PROPOFOL 10 MG/ML IV BOLUS
INTRAVENOUS | Status: AC
  Filled 2024-06-21: qty 40

## 2024-06-21 MED ORDER — SODIUM CHLORIDE 0.9 % IV SOLN: INTRAVENOUS | Status: DC

## 2024-06-21 NOTE — Transfer of Care (Signed)
 Immediate Anesthesia Transfer of Care Note  Patient: Eric Andersen  Procedure(s) Performed: COLONOSCOPY COLONOSCOPY, WITH POLYPECTOMY  Patient Location: PACU and Endoscopy Unit  Anesthesia Type:General  Level of Consciousness: awake, alert , and oriented  Airway & Oxygen Therapy: Patient Spontanous Breathing  Post-op Assessment: Report given to RN and Post -op Vital signs reviewed and stable  Post vital signs: Reviewed and stable  Last Vitals:  Vitals Value Taken Time  BP 115/72 06/21/24 11:59  Temp    Pulse 59 06/21/24 12:00  Resp 16 06/21/24 12:00  SpO2 100 % 06/21/24 12:00  Vitals shown include unfiled device data.  Last Pain:  Vitals:   06/21/24 1037  TempSrc: Tympanic  PainSc: 0-No pain         Complications: No notable events documented.

## 2024-06-21 NOTE — Anesthesia Preprocedure Evaluation (Signed)
 "                                  Anesthesia Evaluation  Patient identified by MRN, date of birth, ID band Patient awake    Reviewed: Allergy & Precautions, H&P , NPO status , Patient's Chart, lab work & pertinent test results, reviewed documented beta blocker date and time   Airway Mallampati: II   Neck ROM: full    Dental  (+) Poor Dentition   Pulmonary sleep apnea    Pulmonary exam normal        Cardiovascular Exercise Tolerance: Poor hypertension, On Medications negative cardio ROS Normal cardiovascular exam Rhythm:regular Rate:Normal     Neuro/Psych TIAResidual Symptoms  negative psych ROS   GI/Hepatic negative GI ROS, Neg liver ROS,,,  Endo/Other  negative endocrine ROSdiabetes, Well Controlled    Renal/GU Renal disease  negative genitourinary   Musculoskeletal   Abdominal   Peds  Hematology negative hematology ROS (+)   Anesthesia Other Findings Past Medical History: No date: Arthritis No date: Diabetes mellitus without complication (HCC) No date: History of kidney stones No date: Hypercholesterolemia No date: Hypertension No date: Sleep apnea No date: Stroke [facility]) Past Surgical History: No date: CARDIAC CATHETERIZATION 01/11/2019: COLONOSCOPY WITH PROPOFOL ; N/A     Comment:  Procedure: COLONOSCOPY WITH PROPOFOL ;  Surgeon: [place],               Ladell POUR, MD;  Location: [facility] ENDOSCOPY;  Service:               Gastroenterology;  Laterality: N/A; 06/23/2017: PACEMAKER INSERTION; Left     Comment:  Procedure: INSERTION PACEMAKER INITIAL INSERT-DUAL               CHAMBER;  Surgeon: Ammon Blunt, MD;  Location:               [facility] ORS;  Service: Cardiovascular;  Laterality: Left; 06/23/2017: PACEMAKER LEAD REMOVAL; N/A     Comment:  Procedure: PACEMAKER LEAD REVISION;  Surgeon: Ammon Blunt, MD;  Location: [facility] ORS;  Service:               Cardiovascular;  Laterality: N/A; 06/22/2017: TEMPORARY PACEMAKER; N/A      Comment:  Procedure: TEMPORARY PACEMAKER;  Surgeon: Florencio Cara BIRCH, MD;  Location: [facility] INVASIVE CV LAB;  Service:               Cardiovascular;  Laterality: N/A; BMI    Body Mass Index: 23.96 kg/m     Reproductive/Obstetrics negative OB ROS                              Anesthesia Physical Anesthesia Plan  ASA: 3  Anesthesia Plan: General   Post-op Pain Management:    Induction:   PONV Risk Score and Plan:   Airway Management Planned:   Additional Equipment:   Intra-op Plan:   Post-operative Plan:   Informed Consent: I have reviewed the patients History and Physical, chart, labs and discussed the procedure including the risks, benefits and alternatives for the proposed anesthesia with the patient or authorized representative who has indicated his/her understanding and acceptance.     Dental Advisory Given  Plan Discussed with:  CRNA  Anesthesia Plan Comments:         Anesthesia Quick Evaluation  "

## 2024-06-21 NOTE — H&P (Signed)
 Outpatient short stay form Pre-procedure 06/21/2024 11:33 AM Eric Andersen K. Aundria, M.D.  Primary Physician: Rock Pounds, M.D.  Reason for visit:  History of adenomatous colon polyps  History of present illness:  Eric Andersen presents to the [facility] GI clinic after receiving colon letter in the mail telling him it was time to have repeat colonoscopy. Last colonoscopy performed Aug 2020 by Dr. Aundria at [facility] showed pandiverticulosis and otherwise normal examined colon. He also had negative colon in 2015. He did have one subcentimeter TA removed from descending colon in 2009. He denies any known family history of colorectal cancer or advanced adenomas. He reports over the last year he has noticed a change in his bowel habits where he is more constipated. He is now going 2-3 days in between bowel movements which is unusual for him. He used to go daily in the morning, but now will skip a day in between bowel movements. He denies straining with defecation. He has tried OTC stool softeners as needed for symptom control. He has not tried Miralax . He denies any issues with hematochezia, melena, fecal urgency, or fecal incontinence. Appetite and diet are stable without any unintentional weight loss. He denies any UGI symptoms such as nausea, vomiting, esophageal dysphagia, odynophagia, early satiety, hoarseness, or epigastric abdominal pain. He does follow in Cardiology with Dr. 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 No recent ED visits or hospitalizations. No other questions at this time.    Current Medications[1]  Medications Prior to Admission  Medication Sig Dispense Refill Last Dose/Taking   amLODipine  (NORVASC ) 10 MG tablet Take 10 mg by mouth daily.   06/21/2024 at  8:00 AM   metFORMIN (GLUCOPHAGE) 1000 MG tablet Take 500 mg by mouth 2 (two) times daily with a meal.   06/21/2024 at  8:00 AM   metoprolol succinate (TOPROL-XL) 50 MG 24 hr tablet Take 50 mg by mouth  daily. Take with or immediately following a meal.   06/21/2024 at  8:00 AM   olmesartan (BENICAR) 40 MG tablet Take 40 mg by mouth daily.   06/21/2024 Morning   aspirin  EC 81 MG EC tablet Take 1 tablet (81 mg total) by mouth daily. Swallow whole. 30 tablet 11    atorvastatin  (LIPITOR ) 80 MG tablet Take 80 mg by mouth daily.      carbamazepine  (TEGRETOL  XR) 200 MG 12 hr tablet Take 200 mg by mouth 2 (two) times daily.      furosemide  (LASIX ) 20 MG tablet Take 20 mg by mouth daily.       glipiZIDE (GLUCOTROL XL) 2.5 MG 24 hr tablet Take 2.5 mg by mouth daily with breakfast.      linagliptin (TRADJENTA) 5 MG TABS tablet Take 5 mg by mouth daily.      niacin  500 MG tablet Take 500 mg by mouth at bedtime.        Allergies[2]   Past Medical History:  Diagnosis Date   Arthritis    Diabetes mellitus without complication (HCC)    History of kidney stones    Hypercholesterolemia    Hypertension    Sleep apnea    Stroke [name])     Review of systems:  Otherwise negative.    Physical Exam  Gen: Alert, oriented. Appears stated age.  HEENT: [place]/AT. PERRLA. Lungs: CTA, no wheezes. CV: RR nl S1, S2. Abd: soft, benign, no masses. BS+ Ext: No edema. Pulses 2+    Planned procedures: Proceed with colonoscopy.  The patient understands the nature of the planned procedure, indications, risks, alternatives and potential complications including but not limited to bleeding, infection, perforation, damage to internal organs and possible oversedation/side effects from anesthesia. The patient agrees and gives consent to proceed.  Please refer to procedure notes for findings, recommendations and patient disposition/instructions.     Eric Andersen K. Aundria, M.D. Gastroenterology 06/21/2024  11:33 AM          [1]  Current Facility-Administered Medications:    0.9 %  sodium chloride  infusion, , Intravenous, Continuous, Earle, Floreine Kingdon K, MD, Last Rate: 20 mL/hr at 06/21/24 1117, Continued from Pre-op at  06/21/24 1117 [2]  Allergies Allergen Reactions   Celecoxib Other (See Comments)    KIDNEY FAILURE    Clonidine     BRADYCARDIA   Diovan [Valsartan] Cough   Micardis [Telmisartan] Cough   Ace Inhibitors Rash   Hydrochlorothiazide Rash

## 2024-06-21 NOTE — Anesthesia Postprocedure Evaluation (Signed)
"   Anesthesia Post Note  Patient: Eric Andersen  Procedure(s) Performed: COLONOSCOPY COLONOSCOPY, WITH POLYPECTOMY  Patient location during evaluation: PACU Anesthesia Type: General Level of consciousness: awake and alert Pain management: pain level controlled Vital Signs Assessment: post-procedure vital signs reviewed and stable Respiratory status: spontaneous breathing, nonlabored ventilation, respiratory function stable and patient connected to nasal cannula oxygen Cardiovascular status: blood pressure returned to baseline and stable Postop Assessment: no apparent nausea or vomiting Anesthetic complications: no   No notable events documented.   Last Vitals:  Vitals:   06/21/24 1209 06/21/24 1221  BP: (!) 124/90 (!) 141/85  Pulse: 61 (!) 59  Resp: 13 20  Temp:    SpO2: 100% 100%    Last Pain:  Vitals:   06/21/24 1201  TempSrc:   PainSc: 0-No pain                 [place] KANDICE Clause      "

## 2024-06-21 NOTE — Interval H&P Note (Signed)
 History and Physical Interval Note:  06/21/2024 11:34 AM  Eric Andersen  has presented today for surgery, with the diagnosis of Hx of adenomatous polyp of colon (Z86.0101).  The various methods of treatment have been discussed with the patient and family. After consideration of risks, benefits and other options for treatment, the patient has consented to  Procedures: COLONOSCOPY (N/A) as a surgical intervention.  The patient's history has been reviewed, patient examined, no change in status, stable for surgery.  I have reviewed the patient's chart and labs.  Questions were answered to the patient's satisfaction.     [name], [name]

## 2024-06-21 NOTE — Op Note (Signed)
 [facility] Gastroenterology Patient Name: Eric Andersen Procedure Date: 06/21/2024 11:28 AM MRN: [id number] Account #: [id number] Date of Birth: 04-16-1946 Admit Type: Outpatient Age: 79 Room: [facility] ENDO ROOM 3 Gender: Male Note Status: Finalized Instrument Name: Colon Scope [id number] Procedure:             Colonoscopy Indications:           High risk colon cancer surveillance: Personal history                         of non-advanced adenoma Providers:             Poetry Cerro K. Aundria MD, MD Referring MD:          [name], MD (Referring MD) Medicines:             Propofol  per Anesthesia Complications:         No immediate complications. Estimated blood loss:                         Minimal. Procedure:             Pre-Anesthesia Assessment:                        - The risks and benefits of the procedure and the                         sedation options and risks were discussed with the                         patient. All questions were answered and informed                         consent was obtained.                        - Patient identification and proposed procedure were                         verified prior to the procedure by the nurse. The                         procedure was verified in the procedure room.                        - ASA Grade Assessment: II - A patient with mild                         systemic disease.                        - After reviewing the risks and benefits, the patient                         was deemed in satisfactory condition to undergo the                         procedure.                        After obtaining informed consent,  the colonoscope was                         passed under direct vision. Throughout the procedure,                         the patient's blood pressure, pulse, and oxygen                         saturations were monitored continuously. The                         Colonoscope was introduced through  the anus and                         advanced to the the cecum, identified by appendiceal                         orifice and ileocecal valve. The colonoscopy was                         performed without difficulty. The patient tolerated                         the procedure well. The quality of the bowel                         preparation was good. The ileocecal valve, appendiceal                         orifice, and rectum were photographed. Findings:      The perianal and digital rectal examinations were normal. Pertinent       negatives include normal sphincter tone and no palpable rectal lesions.      A few medium-mouthed diverticula were found in the transverse colon and       ascending colon. Estimated blood loss: none.      A 5 mm polyp was found in the ascending colon. The polyp was sessile.       The polyp was removed with a jumbo cold forceps. Resection and retrieval       were complete. Estimated blood loss was minimal.      The exam was otherwise without abnormality on direct and retroflexion       views. Impression:            - Diverticulosis in the transverse colon and in the                         ascending colon.                        - One 5 mm polyp in the ascending colon, removed with                         a jumbo cold forceps. Resected and retrieved.                        - The examination was otherwise normal on direct and  retroflexion views. Recommendation:        - Patient has a contact number available for                         emergencies. The signs and symptoms of potential                         delayed complications were discussed with the patient.                         Return to normal activities tomorrow. Written                         discharge instructions were provided to the patient.                        - Resume previous diet.                        - Continue present medications.                        - If  polyps are benign or adenomatous without                         dysplasia, I will advise NO further colonoscopy due to                         advanced age and/or severe comorbidity.                        - Return to GI office PRN.                        - The findings and recommendations were discussed with                         the patient. Procedure Code(s):     --- Professional ---                        [id number], Colonoscopy, flexible; with biopsy, single or                         multiple Diagnosis Code(s):     --- Professional ---                        K57.30, Diverticulosis of large intestine without                         perforation or abscess without bleeding                        D12.2, Benign neoplasm of ascending colon                        Z86.010, Personal history of colonic polyps CPT copyright 2022 [employer]. All rights reserved. The codes documented in this report are preliminary and upon coder review may  be revised to meet current compliance requirements. Wil Slape K Dalicia Kisner MD, MD 06/21/2024  12:00:39 PM This report has been signed electronically. Number of Addenda: 0 Note Initiated On: 06/21/2024 11:28 AM Scope Withdrawal Time: 0 hours 8 minutes 40 seconds  Total Procedure Duration: 0 hours 13 minutes 28 seconds  Estimated Blood Loss:  Estimated blood loss was minimal. Estimated blood loss                         was minimal. Estimated blood loss was minimal.      [facility]

## 2024-06-22 LAB — SURGICAL PATHOLOGY
